# Patient Record
Sex: Male | Born: 1980 | Race: White | Hispanic: No | Marital: Married | Smoking: Former smoker
Health system: Southern US, Community
[De-identification: ages and names within clinical notes are randomized; demographics above are authoritative.]

## PROBLEM LIST (undated history)

## (undated) DIAGNOSIS — K219 Gastro-esophageal reflux disease without esophagitis: Secondary | ICD-10-CM

## (undated) DIAGNOSIS — I10 Essential (primary) hypertension: Secondary | ICD-10-CM

## (undated) DIAGNOSIS — E785 Hyperlipidemia, unspecified: Secondary | ICD-10-CM

## (undated) HISTORY — DX: Hyperlipidemia, unspecified: E78.5

## (undated) HISTORY — DX: Gastro-esophageal reflux disease without esophagitis: K21.9

## (undated) HISTORY — DX: Essential (primary) hypertension: I10

## (undated) HISTORY — PX: INNER EAR SURGERY: SHX679

## (undated) HISTORY — PX: FRACTURE SURGERY: SHX138

## (undated) HISTORY — PX: FOOT SURGERY: SHX648

## (undated) MED FILL — Medication: Fill #2 | Status: CN

---
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
## 2003-11-23 ENCOUNTER — Ambulatory Visit: Payer: Self-pay | Admitting: *Deleted

## 2004-10-07 ENCOUNTER — Emergency Department (HOSPITAL_COMMUNITY): Admission: EM | Admit: 2004-10-07 | Discharge: 2004-10-08 | Payer: Self-pay | Admitting: Emergency Medicine

## 2004-10-17 ENCOUNTER — Emergency Department (HOSPITAL_COMMUNITY): Admission: EM | Admit: 2004-10-17 | Discharge: 2004-10-17 | Payer: Self-pay | Admitting: Emergency Medicine

## 2004-10-27 ENCOUNTER — Encounter: Admission: RE | Admit: 2004-10-27 | Discharge: 2004-10-27 | Payer: Self-pay | Admitting: Internal Medicine

## 2004-10-27 ENCOUNTER — Ambulatory Visit (HOSPITAL_COMMUNITY): Admission: RE | Admit: 2004-10-27 | Discharge: 2004-10-27 | Payer: Self-pay | Admitting: Internal Medicine

## 2005-01-01 ENCOUNTER — Emergency Department (HOSPITAL_COMMUNITY): Admission: EM | Admit: 2005-01-01 | Discharge: 2005-01-01 | Payer: Self-pay | Admitting: Emergency Medicine

## 2005-03-26 ENCOUNTER — Emergency Department (HOSPITAL_COMMUNITY): Admission: EM | Admit: 2005-03-26 | Discharge: 2005-03-26 | Payer: Self-pay | Admitting: *Deleted

## 2005-03-28 ENCOUNTER — Emergency Department (HOSPITAL_COMMUNITY): Admission: EM | Admit: 2005-03-28 | Discharge: 2005-03-28 | Payer: Self-pay | Admitting: Family Medicine

## 2005-07-10 ENCOUNTER — Emergency Department (HOSPITAL_COMMUNITY): Admission: EM | Admit: 2005-07-10 | Discharge: 2005-07-10 | Payer: Self-pay | Admitting: Emergency Medicine

## 2005-09-13 ENCOUNTER — Emergency Department (HOSPITAL_COMMUNITY): Admission: EM | Admit: 2005-09-13 | Discharge: 2005-09-13 | Payer: Self-pay | Admitting: Emergency Medicine

## 2006-01-24 ENCOUNTER — Emergency Department (HOSPITAL_COMMUNITY): Admission: EM | Admit: 2006-01-24 | Discharge: 2006-01-24 | Payer: Self-pay | Admitting: Emergency Medicine

## 2006-02-20 ENCOUNTER — Emergency Department (HOSPITAL_COMMUNITY): Admission: EM | Admit: 2006-02-20 | Discharge: 2006-02-20 | Payer: Self-pay | Admitting: Emergency Medicine

## 2006-05-08 ENCOUNTER — Emergency Department (HOSPITAL_COMMUNITY): Admission: EM | Admit: 2006-05-08 | Discharge: 2006-05-08 | Payer: Self-pay | Admitting: Emergency Medicine

## 2006-05-09 ENCOUNTER — Emergency Department (HOSPITAL_COMMUNITY): Admission: EM | Admit: 2006-05-09 | Discharge: 2006-05-09 | Payer: Self-pay | Admitting: Emergency Medicine

## 2006-05-13 ENCOUNTER — Emergency Department (HOSPITAL_COMMUNITY): Admission: EM | Admit: 2006-05-13 | Discharge: 2006-05-13 | Payer: Self-pay | Admitting: Emergency Medicine

## 2015-09-07 ENCOUNTER — Emergency Department
Admission: EM | Admit: 2015-09-07 | Discharge: 2015-09-07 | Disposition: A | Discharge status: Home / Self Care | Payer: Self-pay | Attending: Student | Admitting: Student

## 2015-09-07 ENCOUNTER — Encounter: Payer: Self-pay | Admitting: Emergency Medicine

## 2015-09-07 DIAGNOSIS — K047 Periapical abscess without sinus: Secondary | ICD-10-CM | POA: Insufficient documentation

## 2015-09-07 MED ORDER — AMOXICILLIN 500 MG PO CAPS: 500.0000 mg | ORAL_CAPSULE | Freq: Three times a day (TID) | ORAL | Status: DC

## 2015-09-07 MED ORDER — IBUPROFEN 600 MG PO TABS: 600.0000 mg | ORAL_TABLET | Freq: Three times a day (TID) | ORAL | Status: DC | PRN

## 2015-09-07 NOTE — Discharge Instructions (Signed)
Dental Abscess A dental abscess is pus in or around a tooth. HOME CARE  Take medicines only as told by your dentist.  If you were prescribed antibiotic medicine, finish all of it even if you start to feel better.  Rinse your mouth (gargle) often with salt water.  Do not drive or use heavy machinery, like a lawn mower, while taking pain medicine.  Do not apply heat to the outside of your mouth.  Keep all follow-up visits as told by your dentist. This is important. GET HELP IF:  Your pain is worse, and medicine does not help. GET HELP RIGHT AWAY IF:  You have a fever or chills.  Your symptoms suddenly get worse.  You have a very bad headache.  You have problems breathing or swallowing.  You have trouble opening your mouth.  You have puffiness (swelling) in your neck or around your eye.   This information is not intended to replace advice given to you by your health care provider. Make sure you discuss any questions you have with your health care provider.   Document Released: 07/07/2014 Document Reviewed: 07/07/2014 Elsevier Interactive Patient Education 2016 [place] [facility]  [place] [facility] and [email or web address] [phone])  [facility] [phone])  [place] [phone] ext [phone])  [place] [phone])  [facility] [phone]) This clinic caters to the indigent population and is on a lottery system. Location: [facility], [place], [place], [place] [facility] Hours: Wednesdays from 6pm - 9pm, patients seen by a lottery system. For dates, call or go to [email or web address] Services: Cleanings, fillings and simple extractions. Payment Options: DENTAL WORK IS FREE OF CHARGE. Bring proof  of income or support. Best way to get seen: Arrive at 5:15 pm - this is a lottery, NOT first come/first serve, so arriving earlier will not increase your chances of being seen.     [facility] [phone] Select option 1 for emergencies   Location: [facility], [place], [address] [place] Clinic Hours: No walk-ins accepted - call the day before to schedule an appointment. Check in times are 9:30 am and 1:30 pm. Services: Simple extractions, temporary fillings, pulpectomy/pulp debridement, uncomplicated abscess drainage. Payment Options: PAYMENT IS DUE AT THE TIME OF SERVICE.  Fee is usually $100-200, additional surgical procedures (e.g. abscess drainage) may be extra. Cash, checks, Visa/MasterCard accepted.  Can file Medicaid if patient is covered for dental - patient should call case worker to check. No discount for [facility] patients. Best way to get seen: MUST call the day before and get onto the schedule. Can usually be seen the next 1-2 days. No walk-ins accepted.     [facility] [phone]   Location: [place], [place] [facility] Hours: M, W, Th, F 8am or 1:30pm, Tues 9a or 1:30 - first come/first served. Services: Simple extractions, temporary fillings, uncomplicated abscess drainage.  You do not need to be an [facility] resident. Payment Options: PAYMENT IS DUE AT THE TIME OF SERVICE. Dental insurance, otherwise sliding scale - bring proof of income or support. Depending on income and treatment needed, cost is usually $50-200. Best way to get seen: Arrive early as it is first come/first served.     [facility] [phone]  Location: °[address] °Clinic Hours: °Mon-Thu 8a-5p °Services: °Most basic dental services including extractions and fillings. °Payment Options: °PAYMENT IS DUE AT THE TIME OF  SERVICE. °Sliding scale, up to 50% off - bring proof if income or support. °Medicaid with dental option accepted. °Best way to get seen: °Call to schedule an appointment, can usually be seen within 2 weeks OR they will try to see walk-ins - show up at 8a or 2p (you may have to wait). °  °  °[facility] °[phone] °[place] RESIDENTS ONLY °  °Location: °[facility], [address], [place], [place] [address] °Clinic Hours: By appointment only. °Monday - Thursday 8am-5pm, Friday 8am-12pm °Services: Cleanings, fillings, extractions. °Payment Options: °PAYMENT IS DUE AT THE TIME OF SERVICE. °Cash, Visa or MasterCard. Sliding scale - $30 minimum per service. °Best way to get seen: °Come in to office, complete packet and make an appointment - need proof of income °or support monies for each household member and proof of [place] residence. °Usually takes about a month to get in. °  °  °[facility] °[phone] °  °Location: °[address] [place] °Clinic Hours: Walk-in Urgent Care Dental Services are offered Monday-Friday mornings only. °The numbers of emergencies accepted daily is limited to the number of °providers available. °Maximum 15 - Mondays, Wednesdays & Thursdays °Maximum 10 - Tuesdays & Fridays °Services: °You do not need to be a  [place] resident to be seen for a dental emergency. °Emergencies are defined as pain, swelling, abnormal bleeding, or dental trauma. Walkins will receive x-rays if needed. °NOTE: Dental cleaning is not an emergency. °Payment Options: °PAYMENT IS DUE AT THE TIME OF SERVICE. °Minimum co-pay is $40.00 for uninsured patients. °Minimum co-pay is $3.00 for Medicaid with dental coverage. °Dental Insurance is accepted and must be presented at time of visit. °Medicare does not cover dental. °Forms of payment: Cash, credit card, checks. °Best way to get seen: °If not previously registered with the clinic,  walk-in dental registration begins at 7:15 am and is on a first come/first serve basis. °If previously registered with the clinic, call to make an appointment. °  °  °[facility] °[phone] °[place] RESIDENTS ONLY °  °Location: °[address], [place], [place] °Clinic Hours: °Mon-Thu 10a-2p °Services: Extractions only! °Payment Options: °FREE (donations accepted) - bring proof of income or support °Best way to get seen: °Call and schedule an appointment OR come at 8am on the 1st Monday of every month (except for holidays) when it is first come/first served. °  °  °[facility] °[phone] °  °Location: °[address], [place] °Clinic Hours: °Friday mornings °Services, Payment Options, Best way to get seen: °Call for info °

## 2015-09-07 NOTE — ED Notes (Signed)
States he developed pain to left side os face about 2 weeks ago  Unsure if it is ear pain or tooth pain  Min relief with OTC meds

## 2015-09-07 NOTE — ED Provider Notes (Signed)
[facility] Emergency Department Provider Note  ____________________________________________  Time seen: Approximately 11:10 AM  I have reviewed the triage vital signs and the nursing notes.   HISTORY  Chief Complaint Facial Pain   HPI Logan Ball is a 35 y.o. male is here complaining of left sided facial pain for about 2 weeks. Patient states he is uncertain whether it is his ear that is causing the pain or his tooth. He is taken over-the-counter medications such as Tylenol without any relief. Patient is unaware of any fever and denies any previous ear infections. He denies any cough, congestion, sore throat or nasal congestion. Patient states he is aware that he does have some problems with his teeth. Wife states that he has been taking Tylenol 500 mg for the last several days without any relief. Currently he rates his pain as an 8/10.   History reviewed. No pertinent past medical history.  There are no active problems to display for this patient.   History reviewed. No pertinent past surgical history.  Current Outpatient Rx  Name  Route  Sig  Dispense  Refill  . amoxicillin (AMOXIL) 500 MG capsule   Oral   Take 1 capsule (500 mg total) by mouth 3 (three) times daily.   30 capsule   0   . ibuprofen (ADVIL,MOTRIN) 600 MG tablet   Oral   Take 1 tablet (600 mg total) by mouth every 8 (eight) hours as needed.   30 tablet   0     Allergies Review of patient's allergies indicates no known allergies.  No family history on file.  Social History Social History  Substance Use Topics  . Smoking status: Never Smoker   . Smokeless tobacco: None  . Alcohol Use: No    Review of Systems Constitutional: No fever/chills Eyes: No visual changes. ENT: No sore throat.Positive left ear pain. Positive dental caries. Cardiovascular: Denies chest pain. Respiratory: Denies shortness of breath. Gastrointestinal:   No nausea, no vomiting.     Musculoskeletal: Negative for back pain. Skin: Negative for rash. Neurological: Negative for headaches, focal weakness or numbness.  10-point ROS otherwise negative.  ____________________________________________   PHYSICAL EXAM:  VITAL SIGNS: ED Triage Vitals  Enc Vitals Group     BP 09/07/15 1053 160/100 mmHg     Pulse Rate 09/07/15 1053 71     Resp 09/07/15 1053 18     Temp 09/07/15 1053 98 F (36.7 C)     Temp src --      SpO2 09/07/15 1053 99 %     Weight 09/07/15 1053 310 lb (140.615 kg)     Height 09/07/15 1053 6' (1.829 m)     Head Cir --      Peak Flow --      Pain Score 09/07/15 1051 8     Pain Loc --      Pain Edu? --      Excl. in GC? --    Constitutional: Alert and oriented. Well appearing and in no acute distress. Eyes: Conjunctivae are normal. PERRL. EOMI. Head: Atraumatic. Nose: No congestion/rhinnorhea.   EACs bilaterally are with moderate amount of cerumen however her TMs are visible and there is no erythema or injection seen. Mouth/Throat: Mucous membranes are moist.  Oropharynx non-erythematous. There are multiple dental caries noted on the upper and lower left side without any obvious abscess. Multiple teeth are eroded down to the gumline. Neck: No stridor.   Cardiovascular: Normal rate, regular rhythm.  Grossly normal heart sounds.  Good peripheral circulation. Respiratory: Normal respiratory effort.  No retractions. Lungs CTAB. Musculoskeletal: Moves upper and lower extremities without any difficulty. Normal gait was noted. Neurologic:  Normal speech and language. No gross focal neurologic deficits are appreciated. No gait instability. Skin:  Skin is warm, dry and intact. No rash noted. Psychiatric: Mood and affect are normal. Speech and behavior are normal.  ____________________________________________   LABS (all labs ordered are listed, but only abnormal results are displayed)  Labs Reviewed - No data to display   PROCEDURES  Procedure(s)  performed: None  Procedures  Critical Care performed: No  ____________________________________________   INITIAL IMPRESSION / ASSESSMENT AND PLAN / ED COURSE  Pertinent labs & imaging results that were available during my care of the patient were reviewed by me and considered in my medical decision making (see chart for details).  Patient does not have any insurance and was given a list of all dental clinics in the area that will care for his teeth a lower cost. Patient was placed on Amoxil 500 mg 3 times a day for 10 days and ibuprofen 600 mg 3 times a day with food as needed for pain. Patient is encouraged to call any of the dental clinics closest to his home. ____________________________________________   FINAL CLINICAL IMPRESSION(S) / ED DIAGNOSES  Final diagnoses:  Dental abscess      NEW MEDICATIONS STARTED DURING THIS VISIT:  Discharge Medication List as of 09/07/2015 11:16 AM    START taking these medications   Details  amoxicillin (AMOXIL) 500 MG capsule Take 1 capsule (500 mg total) by mouth 3 (three) times daily., Starting 09/07/2015, Until Discontinued, Print    ibuprofen (ADVIL,MOTRIN) 600 MG tablet Take 1 tablet (600 mg total) by mouth every 8 (eight) hours as needed., Starting 09/07/2015, Until Discontinued, Print         Note:  This document was prepared using Dragon voice recognition software and may include unintentional dictation errors.    Tommi Rumpshonda L Summers, PA-C 09/07/15 1318  Gayla DossEryka A Gayle, MD 09/07/15 1535

## 2015-12-07 ENCOUNTER — Encounter: Payer: Self-pay | Admitting: Emergency Medicine

## 2015-12-07 ENCOUNTER — Emergency Department
Admission: EM | Admit: 2015-12-07 | Discharge: 2015-12-07 | Disposition: A | Discharge status: Home / Self Care | Payer: Self-pay | Attending: Emergency Medicine | Admitting: Emergency Medicine

## 2015-12-07 DIAGNOSIS — L732 Hidradenitis suppurativa: Secondary | ICD-10-CM | POA: Insufficient documentation

## 2015-12-07 MED ORDER — NAPROXEN 500 MG PO TABS: 500.0000 mg | ORAL_TABLET | Freq: Two times a day (BID) | ORAL | 0 refills | Status: DC

## 2015-12-07 MED ORDER — SULFAMETHOXAZOLE-TRIMETHOPRIM 800-160 MG PO TABS: 1.0000 | ORAL_TABLET | Freq: Two times a day (BID) | ORAL | 0 refills | Status: DC

## 2015-12-07 NOTE — ED Triage Notes (Signed)
C/O "knot" to back of head x 3-4 days.    Raised round area approximately 2 cm diameter with red small red center seen to base of head / neck.   Area hard to palpation.  No drainage seen.

## 2015-12-07 NOTE — ED Notes (Signed)

## 2015-12-07 NOTE — ED Notes (Signed)
See triage note.  Possible abscess area note to back of neck  Developed area about 3-4 days ago

## 2015-12-07 NOTE — ED Provider Notes (Signed)
[facility] Emergency Department Provider Note  ____________________________________________  Time seen: Approximately 4:03 PM  I have reviewed the triage vital signs and the nursing notes.   HISTORY  Chief Complaint Abscess    HPI Logan Ball is a 35 y.o. male , NAD, presents to the emergency department with 3 day history of skin sore on the back of his neck. Patient states he has a long history of recurrent skin sores and "knots" about his body over the course of the last several years. He states that normally they come to a head, lose and resolve on their own. This area has continued to get larger and more painful over the last 24 hours. Denies any oozing or weeping. Has had no fevers, chills, body aches, headaches. Has had no chest pain, shortness of breath, abdominal pain, nausea or vomiting. Denies any history of diabetes. Notes that he has had similar sores under his arms that have been somewhat recurrent. 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   History reviewed. No pertinent past medical history.  There are no active problems to display for this patient.   History reviewed. No pertinent surgical history.  Prior to Admission medications   Medication Sig Start Date End Date Taking? Authorizing Provider  naproxen (NAPROSYN) 500 MG tablet Take 1 tablet (500 mg total) by mouth 2 (two) times daily with a meal. 12/07/15   Jacob Chamblee L Janes Colegrove, PA-C  sulfamethoxazole-trimethoprim (BACTRIM DS,SEPTRA DS) 800-160 MG tablet Take 1 tablet by mouth 2 (two) times daily. 12/07/15   Brody Kump L Yariel Ferraris, PA-C    Allergies Review of patient's allergies indicates no known allergies.  No family history on file.  Social History Social History  Substance Use Topics  . Smoking status: Never Smoker  . Smokeless tobacco: Never Used  . Alcohol use No     Review of Systems  Constitutional: No fever/chills Cardiovascular: No chest  pain. Respiratory: No shortness of breath. Gastrointestinal: No abdominal pain.  No nausea, vomiting.   Musculoskeletal: Positive for posterior neck pain at site of skin sore.  Skin: Positive skin sore back of neck. Negative for rash, redness, oozing, weeping. Neurological: Negative for headaches. 10-point ROS otherwise negative.  ____________________________________________   PHYSICAL EXAM:  VITAL SIGNS: ED Triage Vitals  Enc Vitals Group     BP 12/07/15 1523 130/75     Pulse Rate 12/07/15 1522 95     Resp 12/07/15 1522 16     Temp 12/07/15 1522 98.3 F (36.8 C)     Temp Source 12/07/15 1522 Oral     SpO2 12/07/15 1523 98 %     Weight 12/07/15 1523 (!) 320 lb (145.2 kg)     Height 12/07/15 1523 6\' 1"  (1.854 m)     Head Circumference --      Peak Flow --      Pain Score 12/07/15 1523 6     Pain Loc --      Pain Edu? --      Excl. in GC? --      Constitutional: Alert and oriented. Well appearing and in no acute distress. Eyes: Conjunctivae are normal.  Head: Atraumatic. Neck: Supple with full range of motion. No cervical spine tenderness to palpation. Hematological/Lymphatic/Immunilogical: No cervical lymphadenopathy. Cardiovascular: Good peripheral circulation. Respiratory: Normal respiratory effort without tachypnea or retractions.  Neurologic:  Normal speech and language. No gross focal neurologic deficits are appreciated.  Skin:  2 cm annular cystic-like  lesion noted below the skin about the posterior neck/base of scalp. Area is surrounded by dilated hair follicles in pinpoint lesions. No erythema or abnormal warmth. No oozing or weeping. Skin about patient's bilateral underarms with the same pinpoint lesions and dilated hair follicles with some scarring. Multiple small, non-painful cystic lesions are noted. Skin is warm, dry and intact.  Psychiatric: Mood and affect are normal. Speech and behavior are normal. Patient exhibits appropriate insight and  judgement.   ____________________________________________   LABS  None ____________________________________________  EKG  None ____________________________________________  RADIOLOGY  None ____________________________________________    PROCEDURES  Procedure(s) performed: None   Procedures   Medications - No data to display   ____________________________________________   INITIAL IMPRESSION / ASSESSMENT AND PLAN / ED COURSE  Pertinent labs & imaging results that were available during my care of the patient were reviewed by me and considered in my medical decision making (see chart for details).  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 Patient will be discharged home with prescriptions for Bactrim DS and Naprosyn to take as directed. Patient is to apply warm compress the affected area 20 minutes 3-4 times daily as needed. Patient was given a work note saying he can return to work tomorrow without any restrictions. Patient is to follow up with [facility] if symptoms persist past this treatment course. Patient is also advised to establish care with a local community primary care provider and a list was given for him to research and schedule an appointment. Patient is given ED precautions to return to the ED for any worsening or new symptoms.    ____________________________________________  FINAL CLINICAL IMPRESSION(S) / ED DIAGNOSES  Final diagnoses:  Hydradenitis  Acne inversa      NEW MEDICATIONS STARTED DURING THIS VISIT:  Discharge Medication List as of 12/07/2015  4:08 PM    START taking these medications   Details  naproxen (NAPROSYN) 500 MG tablet Take 1 tablet (500 mg total) by mouth 2 (two) times daily with a meal., Starting Tue 12/07/2015, Print    sulfamethoxazole-trimethoprim (BACTRIM DS,SEPTRA DS) 800-160 MG tablet Take 1 tablet by mouth 2 (two) times daily., Starting Tue 12/07/2015, Print              Ernestene KielJami L MazonHagler, PA-C 12/07/15 1641    Arnaldo NatalPaul F Malinda, MD 12/07/15 2018

## 2019-08-18 ENCOUNTER — Emergency Department
Admission: EM | Admit: 2019-08-18 | Discharge: 2019-08-18 | Disposition: A | Discharge status: Home / Self Care | Payer: Self-pay | Attending: Emergency Medicine | Admitting: Emergency Medicine

## 2019-08-18 ENCOUNTER — Encounter: Payer: Self-pay | Admitting: Emergency Medicine

## 2019-08-18 ENCOUNTER — Other Ambulatory Visit: Payer: Self-pay

## 2019-08-18 ENCOUNTER — Emergency Department: Payer: Self-pay

## 2019-08-18 DIAGNOSIS — I10 Essential (primary) hypertension: Secondary | ICD-10-CM | POA: Insufficient documentation

## 2019-08-18 DIAGNOSIS — R0789 Other chest pain: Secondary | ICD-10-CM | POA: Insufficient documentation

## 2019-08-18 DIAGNOSIS — Z79899 Other long term (current) drug therapy: Secondary | ICD-10-CM | POA: Insufficient documentation

## 2019-08-18 LAB — BASIC METABOLIC PANEL
Anion gap: 6 (ref 5–15)
BUN: 15 mg/dL (ref 6–20)
CO2: 26 mmol/L (ref 22–32)
Calcium: 9.6 mg/dL (ref 8.9–10.3)
Chloride: 109 mmol/L (ref 98–111)
Creatinine, Ser: 0.97 mg/dL (ref 0.61–1.24)
GFR calc Af Amer: >60 mL/min (ref >60)
GFR calc non Af Amer: >60 mL/min (ref >60)
Glucose, Bld: 109 mg/dL — ABNORMAL HIGH (ref 70–99)
Potassium: 3.9 mmol/L (ref 3.5–5.1)
Sodium: 141 mmol/L (ref 135–145)

## 2019-08-18 LAB — CBC
HCT: 42 % (ref 39.0–52.0)
Hemoglobin: 15.4 g/dL (ref 13.0–17.0)
MCH: 29.2 pg (ref 26.0–34.0)
MCHC: 36.7 g/dL — ABNORMAL HIGH (ref 30.0–36.0)
MCV: 79.7 fL — ABNORMAL LOW (ref 80.0–100.0)
Platelets: 308 10*3/uL (ref 150–400)
RBC: 5.27 MIL/uL (ref 4.22–5.81)
RDW: 12.8 % (ref 11.5–15.5)
WBC: 5.8 10*3/uL (ref 4.0–10.5)
nRBC: 0 % (ref 0.0–0.2)

## 2019-08-18 LAB — TROPONIN I (HIGH SENSITIVITY): Troponin I (High Sensitivity): 5 ng/L (ref <18)

## 2019-08-18 MED ORDER — AMLODIPINE BESYLATE 5 MG PO TABS: 5.0000 mg | ORAL_TABLET | Freq: Every day | ORAL | 1 refills | Status: DC

## 2019-08-18 MED ORDER — NAPROXEN 500 MG PO TABS: 500.0000 mg | ORAL_TABLET | Freq: Two times a day (BID) | ORAL | 2 refills | Status: DC

## 2019-08-18 MED ORDER — LOSARTAN POTASSIUM 25 MG PO TABS: 25.0000 mg | ORAL_TABLET | Freq: Every day | ORAL | 1 refills | Status: DC

## 2019-08-18 MED ORDER — NAPROXEN 500 MG PO TABS
500.0000 mg | ORAL_TABLET | Freq: Once | ORAL | Status: AC
  Administered 2019-08-18: 500 mg via ORAL
  Filled 2019-08-18: qty 1

## 2019-08-18 MED ORDER — SODIUM CHLORIDE 0.9% FLUSH: 3.0000 mL | Freq: Once | INTRAVENOUS | Status: DC

## 2019-08-18 NOTE — ED Triage Notes (Signed)
C/O left sided and mid sternal chest pain x 1 day.  Describes pain as sharp.  No aggravating factors associated with pain.  AAOx3. Skin warm and dry. NAD

## 2019-08-18 NOTE — ED Provider Notes (Signed)
[facility] Emergency Department Provider Note   ____________________________________________    I have reviewed the triage vital signs and the nursing notes.   HISTORY  Chief Complaint Chest Pain     HPI Logan Ball is a 39 y.o. male who presents with complaints of chest pain.  Patient reports greater than 24 hours of constant chest discomfort made worse by moving his left arm.  Chest pain is just left of sternum and he reports tenderness to palpation.  He reports significant lifting and physical activity at his job.  No history of heart disease.  No shortness of breath.  No pleurisy.  No radiation.  No calf pain or swelling.  Is not take anything for this.  No nausea vomiting or diaphoresis  History reviewed. No pertinent past medical history.  There are no problems to display for this patient.   History reviewed. No pertinent surgical history.  Prior to Admission medications   Medication Sig Start Date End Date Taking? Authorizing Provider  amLODipine (NORVASC) 5 MG tablet Take 1 tablet (5 mg total) by mouth daily. 08/18/19 08/17/20  Jene Every, MD  losartan (COZAAR) 25 MG tablet Take 1 tablet (25 mg total) by mouth daily. 08/18/19   Jene Every, MD  naproxen (NAPROSYN) 500 MG tablet Take 1 tablet (500 mg total) by mouth 2 (two) times daily with a meal. 08/18/19   Jene Every, MD  sulfamethoxazole-trimethoprim (BACTRIM DS,SEPTRA DS) 800-160 MG tablet Take 1 tablet by mouth 2 (two) times daily. 12/07/15   Hagler, Jami L, PA-C     Allergies Patient has no known allergies.  No family history on file.  Social History Social History   Tobacco Use  . Smoking status: Never Smoker  . Smokeless tobacco: Never Used  Substance Use Topics  . Alcohol use: No  . Drug use: Not on file    Review of Systems  Constitutional: No fever/chills Eyes: No visual changes.  ENT: No sore throat. Cardiovascular: As above Respiratory:  As Gastrointestinal: No abdominal pain.    Genitourinary: Negative for dysuria. Musculoskeletal: Negative for back pain. Skin: Negative for rash. Neurological: Negative for headaches    ____________________________________________   PHYSICAL EXAM:  VITAL SIGNS: ED Triage Vitals  Enc Vitals Group     BP 08/18/19 0940 (!) 170/96     Pulse Rate 08/18/19 0940 88     Resp 08/18/19 0940 16     Temp 08/18/19 0940 97.8 F (36.6 C)     Temp Source 08/18/19 0940 Oral     SpO2 08/18/19 0940 98 %     Weight 08/18/19 0941 (!) 145.2 kg (320 lb 1.7 oz)     Height 08/18/19 0941 1.854 m (6\' 1" )     Head Circumference --      Peak Flow --      Pain Score 08/18/19 0941 8     Pain Loc --      Pain Edu? --      Excl. in GC? --     Constitutional: Alert and oriented.   Nose: No congestion/rhinnorhea. Mouth/Throat: Mucous membranes are moist.    Cardiovascular: Normal rate, regular rhythm. Grossly normal heart sounds.  Good peripheral circulation.  Tenderness to palpation along the left sternal border, no rash Respiratory: Normal respiratory effort.  No retractions. Lungs CTAB. Gastrointestinal: Soft and nontender. No distention.    Musculoskeletal: No lower extremity tenderness nor edema.  Warm and well perfused Neurologic:  Normal speech and language. No  gross focal neurologic deficits are appreciated.  Skin:  Skin is warm, dry and intact. No rash noted. Psychiatric: Mood and affect are normal. Speech and behavior are normal.  ____________________________________________   LABS (all labs ordered are listed, but only abnormal results are displayed)  Labs Reviewed  BASIC METABOLIC PANEL - Abnormal; Notable for the following components:      Result Value   Glucose, Bld 109 (*)    All other components within normal limits  CBC - Abnormal; Notable for the following components:   MCV 79.7 (*)    MCHC 36.7 (*)    All other components within normal limits  TROPONIN I (HIGH  SENSITIVITY)   ____________________________________________  EKG  ED ECG REPORT I, Lavonia Drafts, the attending physician, personally viewed and interpreted this ECG.  Date: 08/18/2019  Rhythm: normal sinus rhythm QRS Axis: normal Intervals: normal ST/T Wave abnormalities: normal Narrative Interpretation: no evidence of acute ischemia  ____________________________________________  RADIOLOGY  Chest x-ray reviewed by me, no pneumothorax or infiltrate ____________________________________________   PROCEDURES  Procedure(s) performed: No  Procedures   Critical Care performed: No ____________________________________________   INITIAL IMPRESSION / ASSESSMENT AND PLAN / ED COURSE  Pertinent labs & imaging results that were available during my care of the patient were reviewed by me and considered in my medical decision making (see chart for details).  Patient presents with chest pain as described above.  Differential includes chest wall pain including costochondritis, less likely ACS myocarditis/pericarditis.  Chest x-ray reviewed by me is reassuring.  EKG is also unremarkable making ACS quite unlikely.  Troponin is normal given that the pain has been constant for greater than 24 hours.  Otherwise lab work is unremarkable.  Patient treated with p.o. naproxen as he refused IM medications because of fear of needles.  Appropriate for discharge at this time with NSAIDs for likely costochondritis.  I will have him follow-up with cardiology regardless given elevated blood pressure, he is not on any medications, will start him on amlodipine and losartan    ____________________________________________   FINAL CLINICAL IMPRESSION(S) / ED DIAGNOSES  Final diagnoses:  Chest wall pain  Essential hypertension        Note:  This document was prepared using Dragon voice recognition software and may include unintentional dictation errors.   Lavonia Drafts, MD 08/18/19  1231

## 2019-11-14 ENCOUNTER — Other Ambulatory Visit: Payer: Self-pay | Admitting: Cardiovascular Disease

## 2019-11-21 ENCOUNTER — Ambulatory Visit: Payer: Self-pay | Admitting: Pharmacy Technician

## 2019-11-21 DIAGNOSIS — Z79899 Other long term (current) drug therapy: Secondary | ICD-10-CM

## 2019-11-26 ENCOUNTER — Other Ambulatory Visit: Payer: Self-pay

## 2019-11-26 NOTE — Progress Notes (Signed)
Completed [facility] application and contract.  Patient agreed to all terms of the [facility] contract.    Patient approved to receive medication assistance at [facility] until time for re-certification in 2022, and as long as eligibility criteria continues to be met.    Provided patient with community resource material based on his particular needs.    Lowen Barringer J. Julianny Milstein Care Manager [facility]  

## 2020-01-22 ENCOUNTER — Other Ambulatory Visit: Payer: Self-pay

## 2020-01-22 ENCOUNTER — Ambulatory Visit: Payer: Self-pay

## 2020-01-22 VITALS — Wt 340.0 lb

## 2020-01-22 DIAGNOSIS — Z79899 Other long term (current) drug therapy: Secondary | ICD-10-CM

## 2020-01-22 NOTE — Progress Notes (Signed)
  [facility] Visit Note  Patient: Logan Ball MRN: [id number] Date of Birth: 05-01-1980 PCP: Patient, No Pcp Per   Logan D Peery 39 y.o. male presents for a medication therapy management via telephone visit today.  Wt (!) 340 lb (154.2 kg) Comment: pt reported  BMI 44.86 kg/m   Patient Information  History reviewed. No pertinent past medical history.    Past Surgical History:  Procedure Laterality Date  . FOOT SURGERY    . INNER EAR SURGERY      History reviewed. No pertinent family history.  New Diagnoses (since last visit): leaky heart valve  Family Support: Fair  Lifestyle Diet: Breakfast: 2 egg and cheese biscuits Lunch: fruit cup Dinner: 2 tacos  Drinks: ~5 sodas/day Snacks: None    Social History   Substance and Sexual Activity  Alcohol Use Yes   Comment: socially      Social History   Tobacco Use  Smoking Status Former Smoker  Smokeless Tobacco Never Used      Health Maintenance  Topic Date Due  . Hepatitis C Screening  Never done  . COVID-19 Vaccine (1) Never done  . HIV Screening  Never done  . TETANUS/TDAP  Never done  . INFLUENZA VACCINE  Never done   Outpatient Encounter Medications as of 01/22/2020  Medication Sig  . amLODipine (NORVASC) 5 MG tablet Take 1 tablet (5 mg total) by mouth daily.  Marland Kitchen atorvastatin (LIPITOR) 40 MG tablet Take 40 mg by mouth daily.  Marland Kitchen losartan (COZAAR) 25 MG tablet Take 1 tablet (25 mg total) by mouth daily. (Patient taking differently: Take 50 mg by mouth daily. )  . Multiple Vitamin (MULTIVITAMIN) capsule Take 1 capsule by mouth daily.  . naproxen (NAPROSYN) 500 MG tablet Take 1 tablet (500 mg total) by mouth 2 (two) times daily with a meal.  . omeprazole (PRILOSEC) 10 MG capsule Take 10 mg by mouth daily.  . [DISCONTINUED] atorvastatin (LIPITOR) 10 MG tablet Take 40 mg by mouth daily.   . [DISCONTINUED] sulfamethoxazole-trimethoprim (BACTRIM DS,SEPTRA DS) 800-160 MG tablet Take 1  tablet by mouth 2 (two) times daily.   No facility-administered encounter medications on file as of 01/22/2020.   Health Maintenance/Date Completed  Last ED visit: 08/18/19 Last Visit to PCP: no PCP - will provide resources Dental Exam: unknown Eye Exam: 2019 Flu Vaccine: pt refused COVID-19 Vaccine: pt refused   Assessment and Plan:  Access/Adherence: Pt does not have a PCP. Recommended pt for [facility] referral.   Hyperlipidemia: Pt takes atorvastatin 40 mg daily. Plan: Continue atorvastatin daily. Establish care with PCP.   Hypertension: Pt takes amlodipine 5 mg daily and losartan 50 mg daily. Pt does not monitor blood pressure at home. Plan: Continue amlodipine and losartan daily. Establish care with PCP.  Reatha Armour, PharmD Pharmacy Resident  01/22/2020 12:37 PM

## 2020-03-16 ENCOUNTER — Other Ambulatory Visit: Payer: Self-pay | Admitting: Cardiovascular Disease

## 2020-04-13 ENCOUNTER — Telehealth: Payer: Self-pay | Admitting: General Practice

## 2020-04-13 NOTE — Telephone Encounter (Signed)
Patient is still interested and was informed to fill out the application and bring it to the office.

## 2020-04-28 ENCOUNTER — Telehealth: Payer: Self-pay | Admitting: Pharmacy Technician

## 2020-04-28 NOTE — Telephone Encounter (Signed)
Received updated proof of income.  Patient eligible to receive medication assistance at [facility] until time for re-certification in 1610, and as long as eligibility requirements continue to be met.  [place] [facility]

## 2020-04-30 ENCOUNTER — Telehealth: Payer: Self-pay | Admitting: Pharmacist

## 2020-04-30 NOTE — Telephone Encounter (Signed)
Provided 2022 proof of income. Approved to receive medication assistance at [facility] until time for re-certification in 5364, and as long as eligibility criteria continues to be met.   [place]

## 2020-06-09 ENCOUNTER — Other Ambulatory Visit: Payer: Self-pay | Admitting: Cardiovascular Disease

## 2020-06-09 ENCOUNTER — Other Ambulatory Visit: Payer: Self-pay

## 2020-06-09 MED ORDER — AMLODIPINE BESYLATE 5 MG PO TABS
ORAL_TABLET | Freq: Every day | ORAL | 0 refills | Status: DC
  Filled 2020-06-09: qty 90, 90d supply, fill #0

## 2020-06-09 MED FILL — Losartan Potassium Tab 100 MG: ORAL | 90 days supply | Qty: 45 | Fill #0 | Status: AC

## 2020-06-14 ENCOUNTER — Other Ambulatory Visit: Payer: Self-pay

## 2020-06-15 ENCOUNTER — Other Ambulatory Visit: Payer: Self-pay

## 2020-06-15 MED ORDER — ATORVASTATIN CALCIUM 40 MG PO TABS
40.0000 mg | ORAL_TABLET | Freq: Every day | ORAL | 3 refills | Status: DC
  Filled 2020-06-15: qty 90, 90d supply, fill #0
  Filled 2020-09-18: qty 90, 90d supply, fill #1
  Filled 2020-12-05: qty 90, 90d supply, fill #2

## 2020-06-16 ENCOUNTER — Other Ambulatory Visit: Payer: Self-pay

## 2020-06-17 ENCOUNTER — Other Ambulatory Visit: Payer: Self-pay

## 2020-06-17 MED ORDER — ATORVASTATIN CALCIUM 40 MG PO TABS
ORAL_TABLET | Freq: Every day | ORAL | 1 refills | Status: DC
  Filled 2020-12-15: qty 90, 90d supply, fill #0
  Filled ????-??-??: fill #0

## 2020-06-18 ENCOUNTER — Other Ambulatory Visit: Payer: Self-pay

## 2020-07-23 ENCOUNTER — Other Ambulatory Visit: Payer: Self-pay

## 2020-09-18 ENCOUNTER — Other Ambulatory Visit: Payer: Self-pay

## 2020-09-18 MED FILL — Losartan Potassium Tab 100 MG: ORAL | 90 days supply | Qty: 45 | Fill #1 | Status: AC

## 2020-09-20 ENCOUNTER — Other Ambulatory Visit: Payer: Self-pay

## 2020-09-21 ENCOUNTER — Other Ambulatory Visit: Payer: Self-pay

## 2020-09-21 MED ORDER — AMLODIPINE BESYLATE 5 MG PO TABS
ORAL_TABLET | ORAL | 0 refills | Status: DC
  Filled 2020-09-21: qty 90, 90d supply, fill #0

## 2020-11-04 IMAGING — CR DG CHEST 2V
1 series · 2 of 2 positions shown · non-contrast
Comparison: 10/27/2004 CT chest also from Monday October, 2004

CLINICAL DATA: Chest pain LEFT-sided and midsternal chest pain for
a day.

EXAM:
CHEST - 2 VIEW

[Series 1: dg chest 2 view · 0.14mm/px · 2 of 2 slices shown]
[im 1/2]
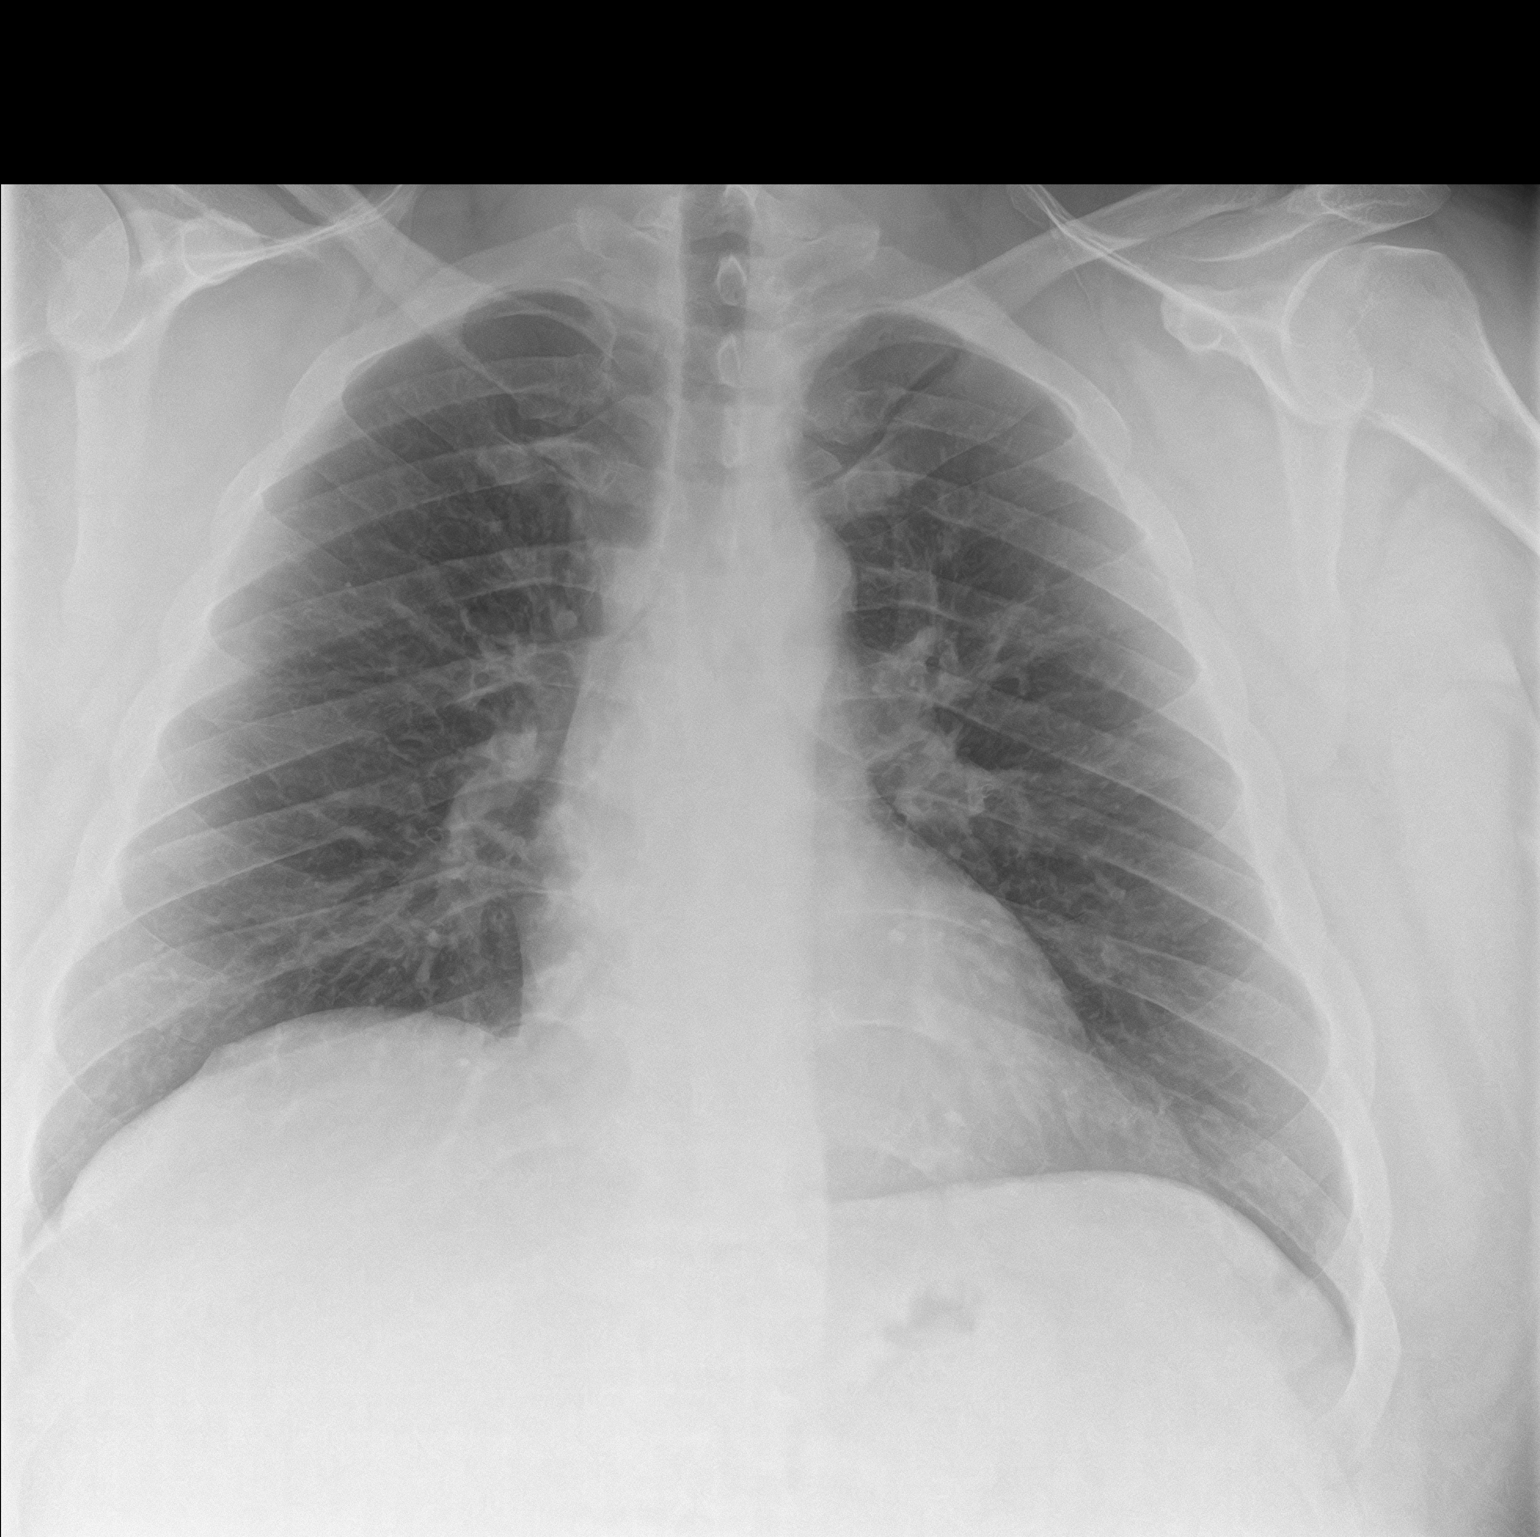
[im 2/2]
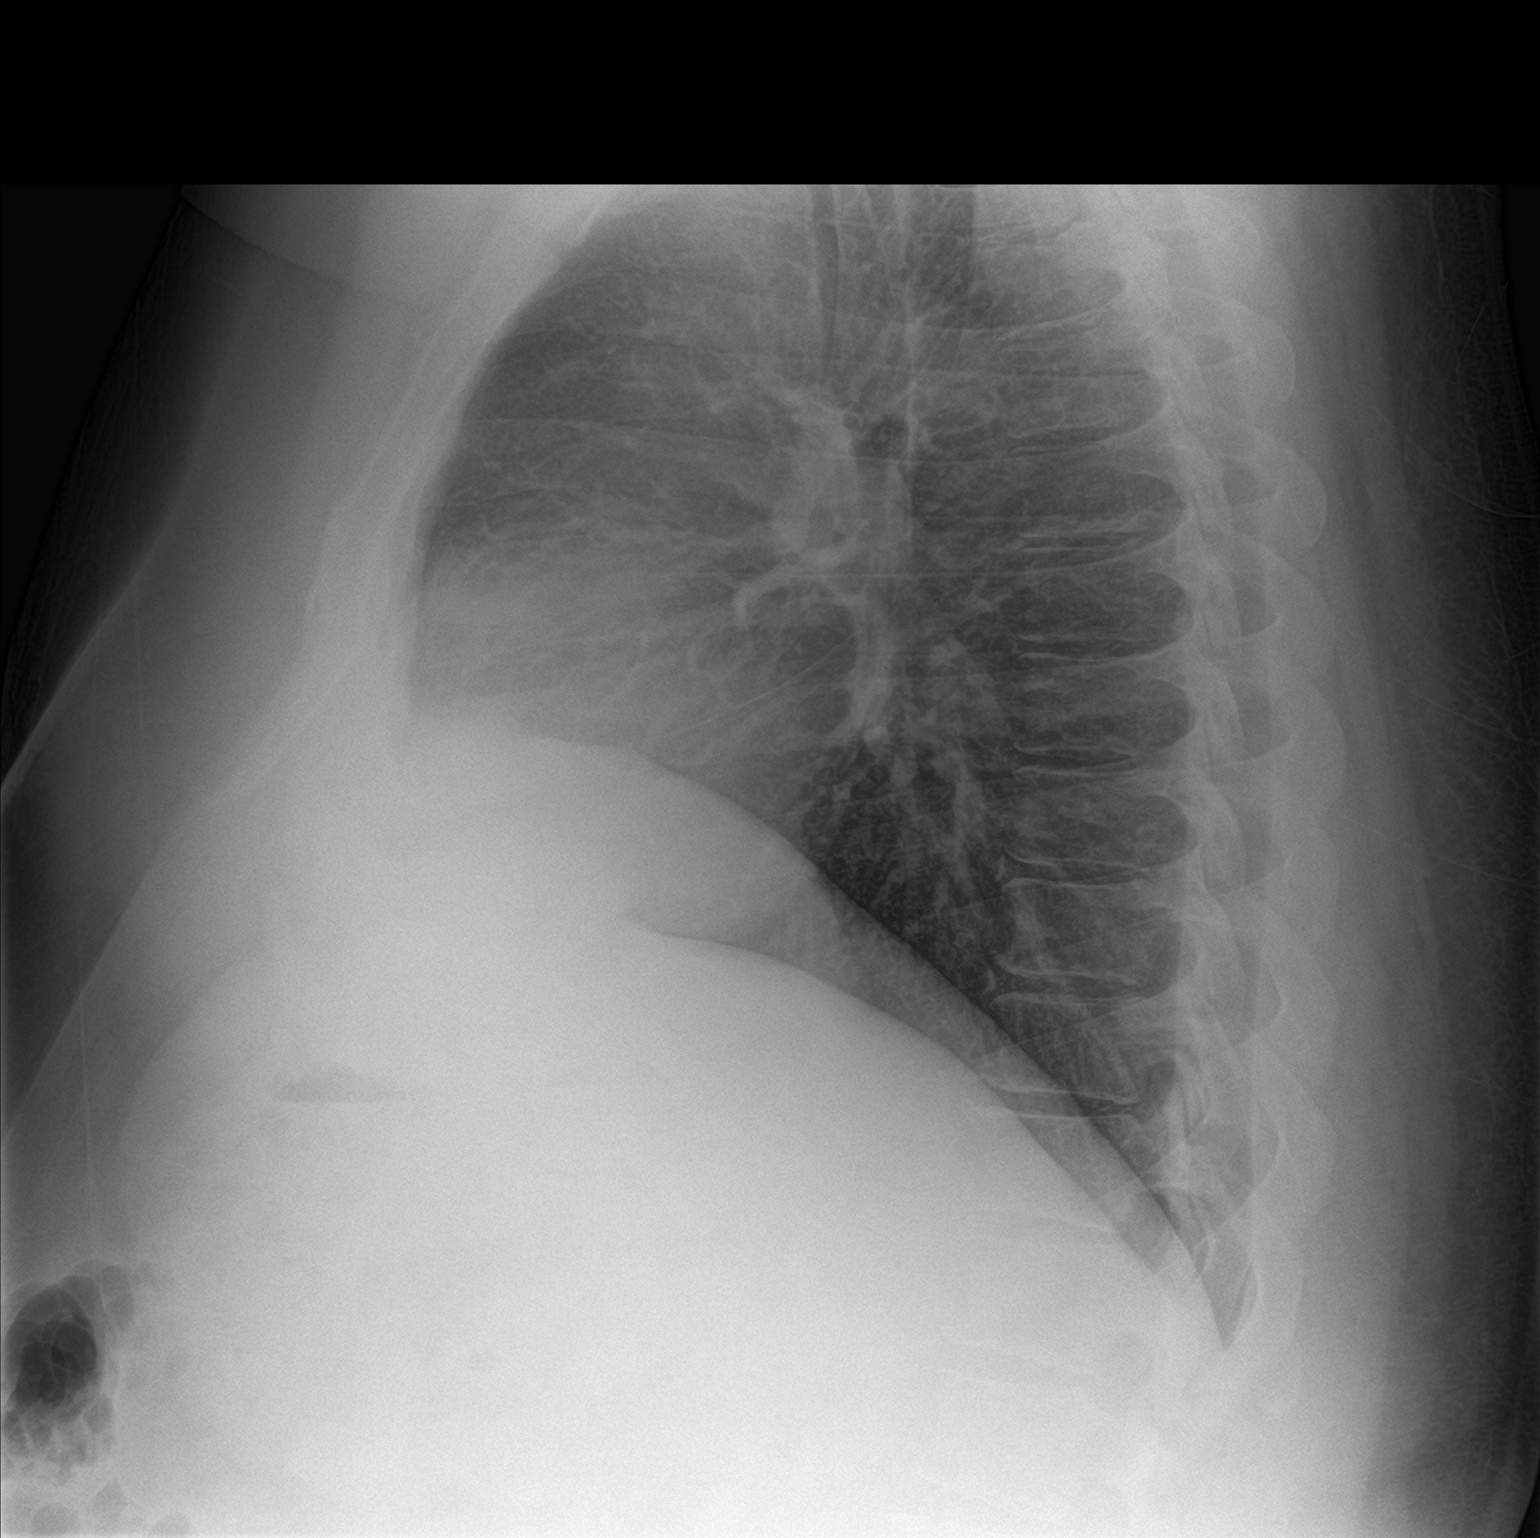

[2 of 2 positions shown; findings below may reference images not displayed]

FINDINGS: Cardiomediastinal contours and hilar structures are normal.

Lungs are clear.

Visualized skeletal structures on limited assessment are
unremarkable.
IMPRESSION: No acute cardiopulmonary disease.

## 2020-12-05 ENCOUNTER — Other Ambulatory Visit: Payer: Self-pay

## 2020-12-05 MED FILL — Losartan Potassium Tab 100 MG: ORAL | 90 days supply | Qty: 45 | Fill #2 | Status: CN

## 2020-12-06 ENCOUNTER — Other Ambulatory Visit: Payer: Self-pay

## 2020-12-07 ENCOUNTER — Other Ambulatory Visit: Payer: Self-pay

## 2020-12-08 ENCOUNTER — Other Ambulatory Visit: Payer: Self-pay

## 2020-12-08 MED ORDER — AMLODIPINE BESYLATE 5 MG PO TABS
ORAL_TABLET | ORAL | 0 refills | Status: DC
  Filled 2020-12-08: qty 90, 90d supply, fill #0

## 2020-12-08 MED ORDER — LOSARTAN POTASSIUM 50 MG PO TABS
50.0000 mg | ORAL_TABLET | Freq: Every day | ORAL | 0 refills | Status: DC
  Filled 2020-12-08: qty 90, 90d supply, fill #0

## 2020-12-15 ENCOUNTER — Other Ambulatory Visit: Payer: Self-pay

## 2020-12-15 MED FILL — Losartan Potassium Tab 100 MG: ORAL | 90 days supply | Qty: 45 | Fill #2 | Status: CN

## 2020-12-16 ENCOUNTER — Other Ambulatory Visit: Payer: Self-pay

## 2021-03-10 ENCOUNTER — Other Ambulatory Visit: Payer: Self-pay

## 2021-03-10 MED ORDER — ATORVASTATIN CALCIUM 40 MG PO TABS
ORAL_TABLET | Freq: Every day | ORAL | 0 refills | Status: DC
  Filled 2021-03-10: qty 90, 90d supply, fill #0

## 2021-03-10 MED ORDER — AMLODIPINE BESYLATE 5 MG PO TABS
ORAL_TABLET | ORAL | 0 refills | Status: DC
  Filled 2021-03-10: qty 90, 90d supply, fill #0

## 2021-03-10 MED ORDER — ATORVASTATIN CALCIUM 80 MG PO TABS
ORAL_TABLET | Freq: Every day | ORAL | 0 refills | Status: DC
  Filled 2021-03-10: qty 30, 30d supply, fill #0

## 2021-03-10 MED ORDER — LOSARTAN POTASSIUM 50 MG PO TABS
50.0000 mg | ORAL_TABLET | Freq: Every day | ORAL | 0 refills | Status: DC
  Filled 2021-03-10: qty 90, 90d supply, fill #0

## 2021-03-10 MED ORDER — METOPROLOL SUCCINATE ER 25 MG PO TB24
ORAL_TABLET | Freq: Every day | ORAL | 0 refills | Status: DC
  Filled 2021-03-10: qty 30, 30d supply, fill #0

## 2021-03-31 ENCOUNTER — Ambulatory Visit: Payer: Self-pay | Admitting: Gerontology

## 2021-03-31 ENCOUNTER — Other Ambulatory Visit: Payer: Self-pay

## 2021-03-31 ENCOUNTER — Encounter: Payer: Self-pay | Admitting: Gerontology

## 2021-03-31 VITALS — BP 142/83 | HR 87 | Temp 98.0°F | Resp 16 | Ht 71.0 in | Wt 339.4 lb

## 2021-03-31 DIAGNOSIS — Z8719 Personal history of other diseases of the digestive system: Secondary | ICD-10-CM

## 2021-03-31 DIAGNOSIS — Z7689 Persons encountering health services in other specified circumstances: Secondary | ICD-10-CM | POA: Insufficient documentation

## 2021-03-31 DIAGNOSIS — Z8639 Personal history of other endocrine, nutritional and metabolic disease: Secondary | ICD-10-CM | POA: Insufficient documentation

## 2021-03-31 DIAGNOSIS — I10 Essential (primary) hypertension: Secondary | ICD-10-CM | POA: Insufficient documentation

## 2021-03-31 DIAGNOSIS — S4992XA Unspecified injury of left shoulder and upper arm, initial encounter: Secondary | ICD-10-CM | POA: Insufficient documentation

## 2021-03-31 MED ORDER — AMLODIPINE BESYLATE 5 MG PO TABS
ORAL_TABLET | ORAL | 0 refills | Status: DC
  Filled 2021-03-31 – 2021-05-01 (×2): qty 90, fill #0

## 2021-03-31 MED ORDER — OMEPRAZOLE 20 MG PO CPDR
20.0000 mg | DELAYED_RELEASE_CAPSULE | Freq: Every day | ORAL | 3 refills | Status: DC
Start: 2021-03-31 — End: 2021-04-05
  Filled 2021-03-31: qty 30, 30d supply, fill #0

## 2021-03-31 MED ORDER — METOPROLOL SUCCINATE ER 25 MG PO TB24
ORAL_TABLET | Freq: Every day | ORAL | 3 refills | Status: DC
  Filled 2021-03-31: qty 30, fill #0
  Filled 2021-04-03: qty 30, 30d supply, fill #0

## 2021-03-31 MED ORDER — LOSARTAN POTASSIUM 50 MG PO TABS
50.0000 mg | ORAL_TABLET | Freq: Every day | ORAL | 0 refills | Status: DC
  Filled 2021-03-31 – 2021-05-01 (×2): qty 90, 90d supply, fill #0

## 2021-03-31 MED ORDER — ATORVASTATIN CALCIUM 80 MG PO TABS
ORAL_TABLET | Freq: Every day | ORAL | 2 refills | Status: DC
  Filled 2021-03-31: qty 30, fill #0
  Filled 2021-04-13: qty 60, 60d supply, fill #0

## 2021-03-31 NOTE — Patient Instructions (Signed)
Heart-Healthy Eating Plan Heart-healthy meal planning includes: Eating less unhealthy fats. Eating more healthy fats. Making other changes in your diet. Talk with your doctor or a diet specialist (dietitian) to create an eating plan that is right for you. What is my plan? Your doctor may recommend an eating plan that includes: Total fat: ______% or less of total calories a day. Saturated fat: ______% or less of total calories a day. Cholesterol: less than _________mg a day. What are tips for following this plan? Cooking Avoid frying your food. Try to bake, boil, grill, or broil it instead. You can also reduce fat by: Removing the skin from poultry. Removing all visible fats from meats. Steaming vegetables in water or broth. Meal planning  At meals, divide your plate into four equal parts: Fill one-half of your plate with vegetables and green salads. Fill one-fourth of your plate with whole grains. Fill one-fourth of your plate with lean protein foods. Eat 4-5 servings of vegetables per day. A serving of vegetables is: 1 cup of raw or cooked vegetables. 2 cups of raw leafy greens. Eat 4-5 servings of fruit per day. A serving of fruit is: 1 medium whole fruit.  cup of dried fruit.  cup of fresh, frozen, or canned fruit.  cup of 100% fruit juice. Eat more foods that have soluble fiber. These are apples, broccoli, carrots, beans, peas, and barley. Try to get 20-30 g of fiber per day. Eat 4-5 servings of nuts, legumes, and seeds per week: 1 serving of dried beans or legumes equals  cup after being cooked. 1 serving of nuts is  cup. 1 serving of seeds equals 1 tablespoon. General information Eat more home-cooked food. Eat less restaurant, buffet, and fast food. Limit or avoid alcohol. Limit foods that are high in starch and sugar. Avoid fried foods. Lose weight if you are overweight. Keep track of how much salt (sodium) you eat. This is important if you have high blood  pressure. Ask your doctor to tell you more about this. Try to add vegetarian meals each week. Fats Choose healthy fats. These include olive oil and canola oil, flaxseeds, walnuts, almonds, and seeds. Eat more omega-3 fats. These include salmon, mackerel, sardines, tuna, flaxseed oil, and ground flaxseeds. Try to eat fish at least 2 times each week. Check food labels. Avoid foods with trans fats or high amounts of saturated fat. Limit saturated fats. These are often found in animal products, such as meats, butter, and cream. These are also found in plant foods, such as palm oil, palm kernel oil, and coconut oil. Avoid foods with partially hydrogenated oils in them. These have trans fats. Examples are stick margarine, some tub margarines, cookies, crackers, and other baked goods. What foods can I eat? Fruits All fresh, canned (in natural juice), or frozen fruits. Vegetables Fresh or frozen vegetables (raw, steamed, roasted, or grilled). Green salads. Grains Most grains. Choose whole wheat and whole grains most of the time. Rice and pasta, including brown rice and pastas made with whole wheat. Meats and other proteins Lean, well-trimmed beef, veal, pork, and lamb. Chicken and turkey without skin. All fish and shellfish. Wild duck, rabbit, pheasant, and venison. Egg whites or low-cholesterol egg substitutes. Dried beans, peas, lentils, and tofu. Seeds and most nuts. Dairy Low-fat or nonfat cheeses, including ricotta and mozzarella. Skim or 1% milk that is liquid, powdered, or evaporated. Buttermilk that is made with low-fat milk. Nonfat or low-fat yogurt. Fats and oils Non-hydrogenated (trans-free) margarines. Vegetable oils, including   soybean, sesame, sunflower, olive, peanut, safflower, corn, canola, and cottonseed. Salad dressings or mayonnaise made with a vegetable oil. Beverages Mineral water. Coffee and tea. Diet carbonated beverages. Sweets and desserts Sherbet, gelatin, and fruit ice.  Small amounts of dark chocolate. Limit all sweets and desserts. Seasonings and condiments All seasonings and condiments. The items listed above may not be a complete list of foods and drinks you can eat. Contact a dietitian for more options. What foods should I avoid? Fruits Canned fruit in heavy syrup. Fruit in cream or butter sauce. Fried fruit. Limit coconut. Vegetables Vegetables cooked in cheese, cream, or butter sauce. Fried vegetables. Grains Breads that are made with saturated or trans fats, oils, or whole milk. Croissants. Sweet rolls. Donuts. High-fat crackers, such as cheese crackers. Meats and other proteins Fatty meats, such as hot dogs, ribs, sausage, bacon, rib-eye roast or steak. High-fat deli meats, such as salami and bologna. Caviar. Domestic duck and goose. Organ meats, such as liver. Dairy Cream, sour cream, cream cheese, and creamed cottage cheese. Whole-milk cheeses. Whole or 2% milk that is liquid, evaporated, or condensed. Whole buttermilk. Cream sauce or high-fat cheese sauce. Yogurt that is made from whole milk. Fats and oils Meat fat, or shortening. Cocoa butter, hydrogenated oils, palm oil, coconut oil, palm kernel oil. Solid fats and shortenings, including bacon fat, salt pork, lard, and butter. Nondairy cream substitutes. Salad dressings with cheese or sour cream. Beverages Regular sodas and juice drinks with added sugar. Sweets and desserts Frosting. Pudding. Cookies. Cakes. Pies. Milk chocolate or white chocolate. Buttered syrups. Full-fat ice cream or ice cream drinks. The items listed above may not be a complete list of foods and drinks to avoid. Contact a dietitian for more information. Summary Heart-healthy meal planning includes eating less unhealthy fats, eating more healthy fats, and making other changes in your diet. Eat a balanced diet. This includes fruits and vegetables, low-fat or nonfat dairy, lean protein, nuts and legumes, whole grains, and  heart-healthy oils and fats. This information is not intended to replace advice given to you by your health care provider. Make sure you discuss any questions you have with your health care provider. Document Revised: 07/01/2020 Document Reviewed: 07/01/2020 Elsevier Patient Education  2022 [employer] DASH Eating Plan DASH stands for Dietary Approaches to Stop Hypertension. The DASH eating plan is a healthy eating plan that has been shown to: Reduce high blood pressure (hypertension). Reduce your risk for type 2 diabetes, heart disease, and stroke. Help with weight loss. What are tips for following this plan? Reading food labels Check food labels for the amount of salt (sodium) per serving. Choose foods with less than 5 percent of the Daily Value of sodium. Generally, foods with less than 300 milligrams (mg) of sodium per serving fit into this eating plan. To find whole grains, look for the word "whole" as the first word in the ingredient list. Shopping Buy products labeled as "low-sodium" or "no salt added." Buy fresh foods. Avoid canned foods and pre-made or frozen meals. Cooking Avoid adding salt when cooking. Use salt-free seasonings or herbs instead of table salt or sea salt. Check with your health care provider or pharmacist before using salt substitutes. Do not fry foods. Cook foods using healthy methods such as baking, boiling, grilling, roasting, and broiling instead. Cook with heart-healthy oils, such as olive, canola, avocado, soybean, or sunflower oil. Meal planning  Eat a balanced diet that includes: 4 or more servings of fruits and 4 or more   servings of vegetables each day. Try to fill one-half of your plate with fruits and vegetables. 6-8 servings of whole grains each day. Less than 6 oz (170 g) of lean meat, poultry, or fish each day. A 3-oz (85-g) serving of meat is about the same size as a deck of cards. One egg equals 1 oz (28 g). 2-3 servings of low-fat dairy each  day. One serving is 1 cup (237 mL). 1 serving of nuts, seeds, or beans 5 times each week. 2-3 servings of heart-healthy fats. Healthy fats called omega-3 fatty acids are found in foods such as walnuts, flaxseeds, fortified milks, and eggs. These fats are also found in cold-water fish, such as sardines, salmon, and mackerel. Limit how much you eat of: Canned or prepackaged foods. Food that is high in trans fat, such as some fried foods. Food that is high in saturated fat, such as fatty meat. Desserts and other sweets, sugary drinks, and other foods with added sugar. Full-fat dairy products. Do not salt foods before eating. Do not eat more than 4 egg yolks a week. Try to eat at least 2 vegetarian meals a week. Eat more home-cooked food and less restaurant, buffet, and fast food. Lifestyle When eating at a restaurant, ask that your food be prepared with less salt or no salt, if possible. If you drink alcohol: Limit how much you use to: 0-1 drink a day for women who are not pregnant. 0-2 drinks a day for men. Be aware of how much alcohol is in your drink. In the [place] one drink equals one 12 oz bottle of beer (355 mL), one 5 oz glass of wine (148 mL), or one 1 oz glass of hard liquor (44 mL). General information Avoid eating more than 2,300 mg of salt a day. If you have hypertension, you may need to reduce your sodium intake to 1,500 mg a day. Work with your health care provider to maintain a healthy body weight or to lose weight. Ask what an ideal weight is for you. Get at least 30 minutes of exercise that causes your heart to beat faster (aerobic exercise) most days of the week. Activities may include walking, swimming, or biking. Work with your health care provider or dietitian to adjust your eating plan to your individual calorie needs. What foods should I eat? Fruits All fresh, dried, or frozen fruit. Canned fruit in natural juice (without added sugar). Vegetables Fresh or frozen  vegetables (raw, steamed, roasted, or grilled). Low-sodium or reduced-sodium tomato and vegetable juice. Low-sodium or reduced-sodium tomato sauce and tomato paste. Low-sodium or reduced-sodium canned vegetables. Grains Whole-grain or whole-wheat bread. Whole-grain or whole-wheat pasta. Brown rice. Oatmeal. Quinoa. Bulgur. Whole-grain and low-sodium cereals. Pita bread. Low-fat, low-sodium crackers. Whole-wheat flour tortillas. Meats and other proteins Skinless chicken or turkey. Ground chicken or turkey. Pork with fat trimmed off. Fish and seafood. Egg whites. Dried beans, peas, or lentils. Unsalted nuts, nut butters, and seeds. Unsalted canned beans. Lean cuts of beef with fat trimmed off. Low-sodium, lean precooked or cured meat, such as sausages or meat loaves. Dairy Low-fat (1%) or fat-free (skim) milk. Reduced-fat, low-fat, or fat-free cheeses. Nonfat, low-sodium ricotta or cottage cheese. Low-fat or nonfat yogurt. Low-fat, low-sodium cheese. Fats and oils Soft margarine without trans fats. Vegetable oil. Reduced-fat, low-fat, or light mayonnaise and salad dressings (reduced-sodium). Canola, safflower, olive, avocado, soybean, and sunflower oils. Avocado. Seasonings and condiments Herbs. Spices. Seasoning mixes without salt. Other foods Unsalted popcorn and pretzels. Fat-free sweets. The items   listed above may not be a complete list of foods and beverages you can eat. Contact a dietitian for more information. What foods should I avoid? Fruits Canned fruit in a light or heavy syrup. Fried fruit. Fruit in cream or butter sauce. Vegetables Creamed or fried vegetables. Vegetables in a cheese sauce. Regular canned vegetables (not low-sodium or reduced-sodium). Regular canned tomato sauce and paste (not low-sodium or reduced-sodium). Regular tomato and vegetable juice (not low-sodium or reduced-sodium). Pickles. Olives. Grains Baked goods made with fat, such as croissants, muffins, or some  breads. Dry pasta or rice meal packs. Meats and other proteins Fatty cuts of meat. Ribs. Fried meat. Bacon. Bologna, salami, and other precooked or cured meats, such as sausages or meat loaves. Fat from the back of a pig (fatback). Bratwurst. Salted nuts and seeds. Canned beans with added salt. Canned or smoked fish. Whole eggs or egg yolks. Chicken or turkey with skin. Dairy Whole or 2% milk, cream, and half-and-half. Whole or full-fat cream cheese. Whole-fat or sweetened yogurt. Full-fat cheese. Nondairy creamers. Whipped toppings. Processed cheese and cheese spreads. Fats and oils Butter. Stick margarine. Lard. Shortening. Ghee. Bacon fat. Tropical oils, such as coconut, palm kernel, or palm oil. Seasonings and condiments Onion salt, garlic salt, seasoned salt, table salt, and sea salt. Worcestershire sauce. Tartar sauce. Barbecue sauce. Teriyaki sauce. Soy sauce, including reduced-sodium. Steak sauce. Canned and packaged gravies. Fish sauce. Oyster sauce. Cocktail sauce. Store-bought horseradish. Ketchup. Mustard. Meat flavorings and tenderizers. Bouillon cubes. Hot sauces. Pre-made or packaged marinades. Pre-made or packaged taco seasonings. Relishes. Regular salad dressings. Other foods Salted popcorn and pretzels. The items listed above may not be a complete list of foods and beverages you should avoid. Contact a dietitian for more information. Where to find more information National Heart, Lung, and Blood Institute: www.nhlbi.nih.gov American Heart Association: www.heart.org Academy of Nutrition and Dietetics: www.eatright.org National Kidney Foundation: www.kidney.org Summary The DASH eating plan is a healthy eating plan that has been shown to reduce high blood pressure (hypertension). It may also reduce your risk for type 2 diabetes, heart disease, and stroke. When on the DASH eating plan, aim to eat more fresh fruits and vegetables, whole grains, lean proteins, low-fat dairy, and  heart-healthy fats. With the DASH eating plan, you should limit salt (sodium) intake to 2,300 mg a day. If you have hypertension, you may need to reduce your sodium intake to 1,500 mg a day. Work with your health care provider or dietitian to adjust your eating plan to your individual calorie needs. This information is not intended to replace advice given to you by your health care provider. Make sure you discuss any questions you have with your health care provider. Document Revised: 01/24/2019 Document Reviewed: 01/24/2019 Elsevier Patient Education  2022 Else[employer] Inc.  
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
## 2021-04-01 LAB — CBC WITH DIFFERENTIAL/PLATELET
Basophils Absolute: 0 10*3/uL (ref 0.0–0.2)
Basos: 1 %
EOS (ABSOLUTE): 0.2 10*3/uL (ref 0.0–0.4)
Eos: 2 %
Hematocrit: 42.9 % (ref 37.5–51.0)
Hemoglobin: 15.2 g/dL (ref 13.0–17.7)
Immature Grans (Abs): 0 10*3/uL (ref 0.0–0.1)
Immature Granulocytes: 0 %
Lymphocytes Absolute: 1.7 10*3/uL (ref 0.7–3.1)
Lymphs: 21 %
MCH: 29.6 pg (ref 26.6–33.0)
MCHC: 35.4 g/dL (ref 31.5–35.7)
MCV: 84 fL (ref 79–97)
Monocytes Absolute: 0.6 10*3/uL (ref 0.1–0.9)
Monocytes: 8 %
Neutrophils Absolute: 5.5 10*3/uL (ref 1.4–7.0)
Neutrophils: 68 %
Platelets: 315 10*3/uL (ref 150–450)
RBC: 5.14 x10E6/uL (ref 4.14–5.80)
RDW: 12.3 % (ref 11.6–15.4)
WBC: 8.1 10*3/uL (ref 3.4–10.8)

## 2021-04-01 LAB — COMPREHENSIVE METABOLIC PANEL
ALT: 21 IU/L (ref 0–44)
AST: 21 IU/L (ref 0–40)
Albumin/Globulin Ratio: 1.5 (ref 1.2–2.2)
Albumin: 4.6 g/dL (ref 4.0–5.0)
Alkaline Phosphatase: 122 IU/L — ABNORMAL HIGH (ref 44–121)
BUN/Creatinine Ratio: 20 (ref 9–20)
BUN: 18 mg/dL (ref 6–24)
Bilirubin Total: 0.5 mg/dL (ref 0.0–1.2)
CO2: 17 mmol/L — ABNORMAL LOW (ref 20–29)
Calcium: 9.9 mg/dL (ref 8.7–10.2)
Chloride: 105 mmol/L (ref 96–106)
Creatinine, Ser: 0.88 mg/dL (ref 0.76–1.27)
Globulin, Total: 3.1 g/dL (ref 1.5–4.5)
Glucose: 104 mg/dL — ABNORMAL HIGH (ref 70–99)
Potassium: 4.6 mmol/L (ref 3.5–5.2)
Sodium: 138 mmol/L (ref 134–144)
Total Protein: 7.7 g/dL (ref 6.0–8.5)
eGFR: 111 mL/min/{1.73_m2} (ref >59)

## 2021-04-01 LAB — LIPID PANEL
Chol/HDL Ratio: 6.8 ratio — ABNORMAL HIGH (ref 0.0–5.0)
Cholesterol, Total: 203 mg/dL — ABNORMAL HIGH (ref 100–199)
HDL: 30 mg/dL — ABNORMAL LOW (ref >39)
LDL Chol Calc (NIH): 101 mg/dL — ABNORMAL HIGH (ref 0–99)
Triglycerides: 424 mg/dL — ABNORMAL HIGH (ref 0–149)
VLDL Cholesterol Cal: 72 mg/dL — ABNORMAL HIGH (ref 5–40)

## 2021-04-01 LAB — HEMOGLOBIN A1C
Est. average glucose Bld gHb Est-mCnc: 103 mg/dL
Hgb A1c MFr Bld: 5.2 % (ref 4.8–5.6)

## 2021-04-04 ENCOUNTER — Other Ambulatory Visit: Payer: Self-pay

## 2021-04-05 ENCOUNTER — Other Ambulatory Visit: Payer: Self-pay

## 2021-04-05 ENCOUNTER — Other Ambulatory Visit: Payer: Self-pay | Admitting: Gerontology

## 2021-04-05 DIAGNOSIS — Z8719 Personal history of other diseases of the digestive system: Secondary | ICD-10-CM

## 2021-04-05 DIAGNOSIS — I10 Essential (primary) hypertension: Secondary | ICD-10-CM

## 2021-04-05 MED ORDER — METOPROLOL SUCCINATE ER 25 MG PO TB24
ORAL_TABLET | Freq: Every day | ORAL | 0 refills | Status: DC
  Filled 2021-04-05: qty 90, fill #0
  Filled 2021-05-01: qty 30, 30d supply, fill #0

## 2021-04-05 MED ORDER — OMEPRAZOLE 20 MG PO CPDR
20.0000 mg | DELAYED_RELEASE_CAPSULE | Freq: Every day | ORAL | 0 refills | Status: DC
  Filled 2021-04-05: qty 90, 90d supply, fill #0
  Filled 2021-05-01: qty 30, 30d supply, fill #0

## 2021-04-13 ENCOUNTER — Other Ambulatory Visit: Payer: Self-pay

## 2021-04-20 ENCOUNTER — Other Ambulatory Visit: Payer: Self-pay

## 2021-05-02 ENCOUNTER — Other Ambulatory Visit: Payer: Self-pay

## 2021-05-03 ENCOUNTER — Other Ambulatory Visit: Payer: Self-pay

## 2021-05-03 ENCOUNTER — Ambulatory Visit: Payer: Self-pay | Admitting: Gerontology

## 2021-05-03 ENCOUNTER — Encounter: Payer: Self-pay | Admitting: Gerontology

## 2021-05-03 VITALS — BP 136/89 | HR 78 | Temp 97.5°F | Resp 16 | Ht 71.0 in | Wt 350.6 lb

## 2021-05-03 DIAGNOSIS — M25561 Pain in right knee: Secondary | ICD-10-CM | POA: Insufficient documentation

## 2021-05-03 DIAGNOSIS — Z8719 Personal history of other diseases of the digestive system: Secondary | ICD-10-CM

## 2021-05-03 DIAGNOSIS — I10 Essential (primary) hypertension: Secondary | ICD-10-CM

## 2021-05-03 DIAGNOSIS — M25562 Pain in left knee: Secondary | ICD-10-CM

## 2021-05-03 DIAGNOSIS — Z8639 Personal history of other endocrine, nutritional and metabolic disease: Secondary | ICD-10-CM

## 2021-05-03 MED ORDER — ATORVASTATIN CALCIUM 80 MG PO TABS
ORAL_TABLET | Freq: Every day | ORAL | 1 refills | Status: DC
  Filled 2021-05-03: qty 90, fill #0
  Filled 2021-06-26: qty 90, 90d supply, fill #0
  Filled 2021-09-19: qty 30, 30d supply, fill #0
  Filled 2021-09-20 – 2021-09-23 (×4): qty 90, 90d supply, fill #0
  Filled ????-??-?? (×2): fill #1

## 2021-05-03 MED ORDER — METOPROLOL SUCCINATE ER 25 MG PO TB24
ORAL_TABLET | Freq: Every day | ORAL | 1 refills | Status: DC
  Filled 2021-05-03 – 2021-05-29 (×2): qty 90, fill #0
  Filled 2021-05-31 – 2021-09-01 (×2): qty 90, 90d supply, fill #0
  Filled 2021-09-01: qty 90, 90d supply, fill #1
  Filled ????-??-??: fill #1

## 2021-05-03 MED ORDER — AMLODIPINE BESYLATE 5 MG PO TABS
ORAL_TABLET | ORAL | 1 refills | Status: DC
  Filled 2021-05-03 – 2021-05-29 (×2): qty 90, fill #0
  Filled 2021-05-31: qty 90, 90d supply, fill #0
  Filled 2021-09-01: qty 30, 30d supply, fill #0
  Filled 2021-09-01: qty 90, 90d supply, fill #1
  Filled ????-??-??: fill #1

## 2021-05-03 MED ORDER — OMEPRAZOLE 20 MG PO CPDR
20.0000 mg | DELAYED_RELEASE_CAPSULE | Freq: Every day | ORAL | 1 refills | Status: DC
  Filled 2021-05-03 – 2021-09-01 (×3): qty 90, 90d supply, fill #0
  Filled 2021-09-01: qty 90, 90d supply, fill #1
  Filled ????-??-??: fill #1

## 2021-05-03 MED ORDER — LOSARTAN POTASSIUM 50 MG PO TABS
50.0000 mg | ORAL_TABLET | Freq: Every day | ORAL | 1 refills | Status: DC
  Filled 2021-05-03 – 2021-09-01 (×3): qty 90, 90d supply, fill #0
  Filled 2021-09-01: qty 90, 90d supply, fill #1
  Filled ????-??-??: fill #1

## 2021-05-03 NOTE — Patient Instructions (Signed)

## 2021-05-03 NOTE — Progress Notes (Signed)
Established Patient Office Visit  Subjective:  Patient ID: Logan Ball, male    DOB: 02-06-1981  Age: 41 y.o. MRN: [id number]  CC:  Chief Complaint  Patient presents with   Follow-up    Labs drawn 03/31/21    HPI Logan Ball is a 41 y/o male who has history of hypertension, hyperlipidemia, acid reflux, presents for lab review and medication refill.  He states that he is compliant with his medication, denies side effects and continues to make healthy lifestyle changes.  He gained more than 10 pounds in 1 month, he states that he drinks 2 L of diet soda daily.  His lab work unremarkable, except his triglyceride was 424 mg per DL, HDL 30 mg per DL, LDL 101 mg per DL and total cholesterol 230 mg per DL. He states that he has chronic bilateral knee pain due to arthritis, states that he does not take any medication and requests handicap sticker because he is unable to ambulate for long distance. He has a history of chest pain in 2021 and was told by the Cardiologist that his Valve is leaking, and request EKG to be done.  But currently he denies chest pain, shortness of breath and palpitation.  Overall, he states that he is doing well and offers no further complaint.    Past Medical History:  Diagnosis Date   GERD (gastroesophageal reflux disease)    Hyperlipidemia    Hypertension     Past Surgical History:  Procedure Laterality Date   FOOT SURGERY Left    FRACTURE SURGERY Left    Clavicle   INNER EAR SURGERY Right     Family History  Problem Relation Age of Onset   COPD Mother    Breast cancer Mother    Aneurysm Mother        brain   Other Father        unknown medical history   COPD Maternal Grandmother    Other Maternal Grandfather        unknown medical history   Other Paternal Grandmother        unknown medical history   Other Paternal Grandfather        unknown medical history    Social History   Socioeconomic History   Marital status: Married    Spouse  name: Not on file   Number of children: Not on file   Years of education: Not on file   Highest education level: Not on file  Occupational History   Not on file  Tobacco Use   Smoking status: Former    Packs/day: 1.50    Years: 14.00    Pack years: 21.00    Types: Cigarettes    Quit date: 2010    Years since quitting: 13.1   Smokeless tobacco: Never   Tobacco comments:    Quit smoking ~2010  Vaping Use   Vaping Use: Never used  Substance and Sexual Activity   Alcohol use: Yes    Comment: socially   Drug use: Never   Sexual activity: Not on file  Other Topics Concern   Not on file  Social History Narrative   Not on file   Social Determinants of Health   Financial Resource Strain: Not on file  Food Insecurity: No Food Insecurity   Worried About Running Out of Food in the Last Year: Never true   Ran Out of Food in the Last Year: Never true  Transportation Needs: No Transportation Needs  Lack of Transportation (Medical): No  ° Lack of Transportation (Non-Medical): No  °Physical Activity: Not on file  °Stress: Not on file  °Social Connections: Not on file  °Intimate Partner Violence: Not on file  ° ° °Outpatient Medications Prior to Visit  °Medication Sig Dispense Refill  ° acetaminophen (TYLENOL) 500 MG tablet Take 500 mg by mouth every 6 (six) hours as needed.    ° Multiple Vitamin (MULTIVITAMIN) capsule Take 1 capsule by mouth daily.    ° amLODipine (NORVASC) 5 MG tablet TAKE ONE TABLET BY MOUTH ONCE EVERY DAY (need appointment for future refills). 90 tablet 0  ° atorvastatin (LIPITOR) 80 MG tablet TAKE 1 TABLET BY MOUTH ONCE DAILY. 30 tablet 2  ° losartan (COZAAR) 50 MG tablet TAKE ONE TABLET BY MOUTH ONCE EVERY DAY 90 tablet 0  ° metoprolol succinate (TOPROL-XL) 25 MG 24 hr tablet TAKE 1 TABLET BY MOUTH ONCE DAILY. 90 tablet 0  ° omeprazole (PRILOSEC) 20 MG capsule Take 1 capsule (20 mg total) by mouth once daily. 90 capsule 0  ° °No facility-administered medications prior to  visit.  ° ° °Allergies  °Allergen Reactions  ° Rosuvastatin Rash  °  Pt is on atorvastatin with no reactions  ° ° °ROS °Review of Systems  °Constitutional: Negative.   °Eyes: Negative.   °Respiratory: Negative.    °Cardiovascular: Negative.   °Endocrine: Negative.   °Musculoskeletal:  Positive for arthralgias (bilateral knee pain due to arthritis).  °Neurological: Negative.   ° °  °Objective:  °  °Physical Exam °HENT:  °   Head: Normocephalic and atraumatic.  °   Mouth/Throat:  °   Mouth: Mucous membranes are moist.  °Eyes:  °   Extraocular Movements: Extraocular movements intact.  °   Conjunctiva/sclera: Conjunctivae normal.  °   Pupils: Pupils are equal, round, and reactive to light.  °Cardiovascular:  °   Rate and Rhythm: Normal rate and regular rhythm.  °   Pulses: Normal pulses.  °   Heart sounds: Normal heart sounds.  °Pulmonary:  °   Effort: Pulmonary effort is normal.  °   Breath sounds: Normal breath sounds.  °Musculoskeletal:     °   General: Normal range of motion.  °Neurological:  °   General: No focal deficit present.  °   Mental Status: He is alert and oriented to person, place, and time. Mental status is at baseline.  °Psychiatric:     °   Mood and Affect: Mood normal.     °   Behavior: Behavior normal.     °   Thought Content: Thought content normal.     °   Judgment: Judgment normal.  ° ° °BP 136/89 (BP Location: Left Arm, Patient Position: Sitting, Cuff Size: Large)    Pulse 78    Temp (!) 97.5 °F (36.4 °C) (Oral)    Resp 16    Ht 5' 11" (1.803 m)    Wt (!) 350 lb 9.6 oz (159 kg)    SpO2 95%    BMI 48.90 kg/m²  °Wt Readings from Last 3 Encounters:  °05/03/21 (!) 350 lb 9.6 oz (159 kg)  °03/31/21 (!) 339 lb 6.4 oz (154 kg)  °01/22/20 (!) 340 lb (154.2 kg)  ° °Encouraged weight loss ° °Health Maintenance Due  °Topic Date Due  ° COVID-19 Vaccine (1) Never done  ° HIV Screening  Never done  ° Hepatitis C Screening  Never done  ° TETANUS/TDAP  Never done  ° ° °  There are no preventive care reminders to  display for this patient. ° °No results found for: TSH °Lab Results  °Component Value Date  ° WBC 8.1 03/31/2021  ° HGB 15.2 03/31/2021  ° HCT 42.9 03/31/2021  ° MCV 84 03/31/2021  ° PLT 315 03/31/2021  ° °Lab Results  °Component Value Date  ° NA 138 03/31/2021  ° K 4.6 03/31/2021  ° CO2 17 (L) 03/31/2021  ° GLUCOSE 104 (H) 03/31/2021  ° BUN 18 03/31/2021  ° CREATININE 0.88 03/31/2021  ° BILITOT 0.5 03/31/2021  ° ALKPHOS 122 (H) 03/31/2021  ° AST 21 03/31/2021  ° ALT 21 03/31/2021  ° PROT 7.7 03/31/2021  ° ALBUMIN 4.6 03/31/2021  ° CALCIUM 9.9 03/31/2021  ° ANIONGAP 6 08/18/2019  ° EGFR 111 03/31/2021  ° °Lab Results  °Component Value Date  ° CHOL 203 (H) 03/31/2021  ° °Lab Results  °Component Value Date  ° HDL 30 (L) 03/31/2021  ° °Lab Results  °Component Value Date  ° LDLCALC 101 (H) 03/31/2021  ° °Lab Results  °Component Value Date  ° TRIG 424 (H) 03/31/2021  ° °Lab Results  °Component Value Date  ° CHOLHDL 6.8 (H) 03/31/2021  ° °Lab Results  °Component Value Date  ° HGBA1C 5.2 03/31/2021  ° ° °  °Assessment & Plan:  ° ° ° °1. History of elevated lipids °-He will continue his current medication, advised to continue on low-fat/cholesterol diet and exercise as tolerated. °- atorvastatin (LIPITOR) 80 MG tablet; TAKE 1 TABLET BY MOUTH ONCE DAILY.  Dispense: 90 tablet; Refill: 1 ° °2. Essential hypertension °-His blood pressure is under control his goal should be less than 140/90, he will continue on current medication, DASH diet. °- amLODipine (NORVASC) 5 MG tablet; TAKE ONE TABLET BY MOUTH ONCE EVERY DAY (need appointment for future refills).  Dispense: 90 tablet; Refill: 1 °- losartan (COZAAR) 50 MG tablet; TAKE ONE TABLET BY MOUTH ONCE EVERY DAY  Dispense: 90 tablet; Refill: 1 °- metoprolol succinate (TOPROL-XL) 25 MG 24 hr tablet; TAKE 1 TABLET BY MOUTH ONCE DAILY.  Dispense: 90 tablet; Refill: 1 °- EKG 12-Lead ° °3. History of gastroesophageal reflux (GERD) °-His acid reflux is under control and he will continue  on current medication. °-Avoid spicy, fatty and fried food °-Avoid sodas and sour juices °-Avoid heavy meals °-Avoid eating 4 hours before bedtime °-Elevate head of bed at night °- omeprazole (PRILOSEC) 20 MG capsule; Take 1 capsule (20 mg total) by mouth once daily.  Dispense: 90 capsule; Refill: 1 ° °4. Pain in both knees, unspecified chronicity °-He will follow-up with Dr. Kernodle for bilateral knee pain. ° °5. Morbid obesity (HCC) °-He was encouraged to cut down on soda, portion control and exercise as tolerated. ° °Follow-up: Return in about 3 months (around 07/28/2021), or if symptoms worsen or fail to improve.  ° ° °[name], NP °

## 2021-05-05 ENCOUNTER — Telehealth: Payer: Self-pay | Admitting: Pharmacy Technician

## 2021-05-05 NOTE — Telephone Encounter (Signed)
Received updated proof of income.  Patient eligible to receive medication assistance at [facility] until time for re-certification in 2024, and as long as eligibility requirements continue to be met. ? ?Keeon Zurn J. Felisa Zechman ?Care Manager ?[facility]  ?

## 2021-05-30 ENCOUNTER — Other Ambulatory Visit: Payer: Self-pay

## 2021-05-31 ENCOUNTER — Other Ambulatory Visit: Payer: Self-pay

## 2021-06-27 ENCOUNTER — Other Ambulatory Visit: Payer: Self-pay

## 2021-07-07 ENCOUNTER — Other Ambulatory Visit: Payer: Self-pay

## 2021-07-12 ENCOUNTER — Ambulatory Visit: Payer: Self-pay | Admitting: Gerontology

## 2021-07-12 ENCOUNTER — Other Ambulatory Visit: Payer: Self-pay

## 2021-07-12 ENCOUNTER — Encounter: Payer: Self-pay | Admitting: Gerontology

## 2021-07-12 VITALS — BP 109/75 | HR 85 | Temp 97.7°F | Resp 17 | Ht 70.0 in | Wt 356.0 lb

## 2021-07-12 DIAGNOSIS — M25561 Pain in right knee: Secondary | ICD-10-CM

## 2021-07-12 DIAGNOSIS — I10 Essential (primary) hypertension: Secondary | ICD-10-CM

## 2021-07-12 NOTE — Patient Instructions (Signed)

## 2021-07-12 NOTE — Progress Notes (Signed)
? ?Established Patient Office Visit ? ?Subjective   ?Patient ID: Logan Ball, male    DOB: 1980/09/10  Age: 41 y.o. MRN: [id number] ? ?Chief Complaint  ?Patient presents with  ? Follow-up  ? ? ?HPI ?Logan Ball is a 41 y/o male who has history of hypertension, hyperlipidemia, acid reflux, presents for lab review and medication refill.  He states that he is compliant with his medication, denies side effects and continues to make healthy lifestyle changes. Currently, he continues to c/o  non traumatic, intermittent non radiating pain to bilateral knee that is chronic. He continues to go to Physical therapy at [facility] for his left shoulder stiffness. Overall, he states that he's doing well and offers no further complaint. ? ? ?Review of Systems  ?Constitutional: Negative.   ?Respiratory: Negative.    ?Cardiovascular: Negative.   ?Gastrointestinal: Negative.   ?Musculoskeletal:   ?     Bilateral knee pain  ?Neurological: Negative.   ? ?  ?Objective:  ?  ? ?BP 109/75 (BP Location: Left Arm, Patient Position: Sitting, Cuff Size: Large)   Pulse 85   Temp 97.7 ?F (36.5 ?C) (Oral)   Resp 17   Ht 5' 10"  (1.778 m)   Wt (!) 356 lb (161.5 kg)   SpO2 95%   BMI 51.08 kg/m?  ?BP Readings from Last 3 Encounters:  ?07/12/21 109/75  ?05/03/21 136/89  ?03/31/21 (!) 142/83  ? ?Wt Readings from Last 3 Encounters:  ?07/12/21 (!) 356 lb (161.5 kg)  ?05/03/21 (!) 350 lb 9.6 oz (159 kg)  ?03/31/21 (!) 339 lb 6.4 oz (154 kg)  ? ? Encouraged weight loss ? ?Physical Exam ?HENT:  ?   Head: Normocephalic and atraumatic.  ?   Mouth/Throat:  ?   Mouth: Mucous membranes are moist.  ?Eyes:  ?   Extraocular Movements: Extraocular movements intact.  ?   Conjunctiva/sclera: Conjunctivae normal.  ?   Pupils: Pupils are equal, round, and reactive to light.  ?Cardiovascular:  ?   Rate and Rhythm: Normal rate and regular rhythm.  ?   Pulses: Normal pulses.  ?   Heart sounds: Normal heart sounds.  ?Pulmonary:  ?   Effort: Pulmonary effort is  normal.  ?   Breath sounds: Normal breath sounds.  ?Musculoskeletal:     ?   General: Normal range of motion.  ?Skin: ?   General: Skin is warm.  ?Neurological:  ?   General: No focal deficit present.  ?   Mental Status: He is alert and oriented to person, place, and time. Mental status is at baseline.  ?Psychiatric:     ?   Mood and Affect: Mood normal.     ?   Behavior: Behavior normal.     ?   Thought Content: Thought content normal.     ?   Judgment: Judgment normal.  ? ? ? ?No results found for any visits on 07/12/21. ? ?Last CBC ?Lab Results  ?Component Value Date  ? WBC 8.1 03/31/2021  ? HGB 15.2 03/31/2021  ? HCT 42.9 03/31/2021  ? MCV 84 03/31/2021  ? MCH 29.6 03/31/2021  ? RDW 12.3 03/31/2021  ? PLT 315 03/31/2021  ? ?Last metabolic panel ?Lab Results  ?Component Value Date  ? GLUCOSE 104 (H) 03/31/2021  ? NA 138 03/31/2021  ? K 4.6 03/31/2021  ? CL 105 03/31/2021  ? CO2 17 (L) 03/31/2021  ? BUN 18 03/31/2021  ? CREATININE 0.88 03/31/2021  ? EGFR 111 03/31/2021  ?  CALCIUM 9.9 03/31/2021  ? PROT 7.7 03/31/2021  ? ALBUMIN 4.6 03/31/2021  ? LABGLOB 3.1 03/31/2021  ? AGRATIO 1.5 03/31/2021  ? BILITOT 0.5 03/31/2021  ? ALKPHOS 122 (H) 03/31/2021  ? AST 21 03/31/2021  ? ALT 21 03/31/2021  ? ANIONGAP 6 08/18/2019  ? ?Last lipids ?Lab Results  ?Component Value Date  ? CHOL 203 (H) 03/31/2021  ? HDL 30 (L) 03/31/2021  ? LDLCALC 101 (H) 03/31/2021  ? TRIG 424 (H) 03/31/2021  ? CHOLHDL 6.8 (H) 03/31/2021  ? ?Last hemoglobin A1c ?Lab Results  ?Component Value Date  ? HGBA1C 5.2 03/31/2021  ? ?Last thyroid functions ?No results found for: TSH, T3TOTAL, T4TOTAL, THYROIDAB ?Last vitamin D ?No results found for: 25OHVITD2, [place], VD25OH ?Last vitamin B12 and Folate ?No results found for: VITAMINB12, FOLATE ?  ? ?The 10-year ASCVD risk score (Arnett DK, et al., 2019) is: 2.6% ? ?  ?Assessment & Plan:  ? ?1. Pain in both knees, unspecified chronicity ?-He was encouraged to take 500 mg Tylenol every 6 hours as needed for  pain, not to exceed 4000 mg in 24 hours.  He will follow-up with Dr. Jefm Bryant at [facility] clinic. ? ?2. Essential hypertension ?-His blood pressure is under control and he will continue current medication.  He was encouraged to continue on DASH diet and exercise as tolerated. ? ?3. Morbid obesity ([place]) ?-He was encouraged to lose weight, continue on portion sizes and low calorie diet. ? ? ?Return in about 3 months (around 10/20/2021), or if symptoms worsen or fail to improve.  ? ? ?Erva Koke Jerold Coombe, NP ? ?

## 2021-07-27 ENCOUNTER — Ambulatory Visit: Payer: Self-pay | Admitting: Gerontology

## 2021-08-10 ENCOUNTER — Other Ambulatory Visit: Payer: Self-pay

## 2021-08-23 ENCOUNTER — Other Ambulatory Visit: Payer: Self-pay

## 2021-08-23 ENCOUNTER — Ambulatory Visit: Payer: Self-pay | Admitting: Rheumatology

## 2021-08-23 ENCOUNTER — Encounter: Payer: Self-pay | Admitting: Rheumatology

## 2021-08-23 VITALS — BP 123/83 | HR 78 | Temp 98.2°F | Resp 17 | Ht 70.0 in | Wt 357.5 lb

## 2021-08-23 DIAGNOSIS — M170 Bilateral primary osteoarthritis of knee: Secondary | ICD-10-CM
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
## 2021-08-23 NOTE — Patient Instructions (Addendum)
If worse, will call for injection Work on weight reduction

## 2021-08-24 ENCOUNTER — Other Ambulatory Visit: Payer: Self-pay

## 2021-09-01 ENCOUNTER — Other Ambulatory Visit: Payer: Self-pay

## 2021-09-19 ENCOUNTER — Other Ambulatory Visit: Payer: Self-pay

## 2021-09-20 ENCOUNTER — Other Ambulatory Visit: Payer: Self-pay

## 2021-09-21 ENCOUNTER — Other Ambulatory Visit: Payer: Self-pay

## 2021-09-22 ENCOUNTER — Other Ambulatory Visit: Payer: Self-pay

## 2021-09-23 ENCOUNTER — Other Ambulatory Visit: Payer: Self-pay

## 2021-10-20 ENCOUNTER — Ambulatory Visit: Payer: Self-pay

## 2021-10-20 ENCOUNTER — Ambulatory Visit: Payer: Self-pay | Admitting: Gerontology

## 2021-10-27 ENCOUNTER — Other Ambulatory Visit: Payer: Self-pay

## 2021-10-27 ENCOUNTER — Ambulatory Visit: Payer: Self-pay | Admitting: Gerontology

## 2021-10-27 VITALS — BP 123/78 | HR 82 | Temp 98.1°F | Wt 367.7 lb

## 2021-10-27 DIAGNOSIS — Z8639 Personal history of other endocrine, nutritional and metabolic disease: Secondary | ICD-10-CM

## 2021-10-27 DIAGNOSIS — Z8719 Personal history of other diseases of the digestive system: Secondary | ICD-10-CM

## 2021-10-27 DIAGNOSIS — I10 Essential (primary) hypertension: Secondary | ICD-10-CM

## 2021-10-27 MED ORDER — LOSARTAN POTASSIUM 50 MG PO TABS
50.0000 mg | ORAL_TABLET | Freq: Every day | ORAL | 1 refills | Status: DC
  Filled 2021-10-27 – 2021-11-28 (×4): qty 90, 90d supply, fill #0
  Filled 2022-02-19: qty 90, 90d supply, fill #1

## 2021-10-27 MED ORDER — ATORVASTATIN CALCIUM 80 MG PO TABS
ORAL_TABLET | Freq: Every day | ORAL | 1 refills | Status: DC
  Filled 2021-10-27 – 2021-10-31 (×3): qty 90, fill #0
  Filled 2021-11-27 – 2021-12-27 (×2): qty 90, 90d supply, fill #0
  Filled 2022-02-19 – 2022-02-20 (×2): qty 90, 90d supply, fill #1

## 2021-10-27 MED ORDER — AMLODIPINE BESYLATE 5 MG PO TABS
ORAL_TABLET | ORAL | 1 refills | Status: DC
  Filled 2021-10-27: qty 90, 90d supply, fill #0
  Filled 2021-11-27 – 2022-01-22 (×2): qty 90, 90d supply, fill #1

## 2021-10-27 MED ORDER — OMEPRAZOLE 20 MG PO CPDR
20.0000 mg | DELAYED_RELEASE_CAPSULE | Freq: Every day | ORAL | 1 refills | Status: DC
  Filled 2021-10-27 – 2021-11-27 (×4): qty 90, 90d supply, fill #0
  Filled 2022-02-19: qty 90, 90d supply, fill #1

## 2021-10-27 MED ORDER — METOPROLOL SUCCINATE ER 25 MG PO TB24
ORAL_TABLET | Freq: Every day | ORAL | 1 refills | Status: DC
  Filled 2021-10-27 – 2021-10-31 (×3): qty 90, fill #0
  Filled 2021-11-27: qty 90, 90d supply, fill #0
  Filled 2022-02-19: qty 90, 90d supply, fill #1

## 2021-10-27 NOTE — Progress Notes (Signed)
Established Patient Office Visit  Subjective   Patient ID: Logan Ball, male    DOB: Sep 03, 1980  Age: 41 y.o. MRN: [id number]  Chief Complaint  Patient presents with   Follow-up    HPI  Logan D Buchanan is a 41 y/o male who has history of hypertension, hyperlipidemia, acid reflux, presents for routine follow up. He continues to see Physical Therapy for left shoulder stiffness. He states that he's compliant with his medications, denies side effects and continues to make healthy lifestyle changes. Overall, he states that he's doing well and offers no further complaint.  Review of Systems  Constitutional: Negative.   Eyes: Negative.   Respiratory: Negative.    Cardiovascular: Negative.   Skin: Negative.   Neurological: Negative.   Psychiatric/Behavioral: Negative.        Objective:     BP 123/78   Pulse 82   Temp 98.1 F (36.7 C)   Wt (!) 367 lb 11.2 oz (166.8 kg)   SpO2 95%   BMI 52.76 kg/m  BP Readings from Last 3 Encounters:  10/27/21 123/78  08/23/21 123/83  07/12/21 109/75   Wt Readings from Last 3 Encounters:  10/27/21 (!) 367 lb 11.2 oz (166.8 kg)  08/23/21 (!) 357 lb 8 oz (162.2 kg)  07/12/21 (!) 356 lb (161.5 kg)      Physical Exam HENT:     Head: Normocephalic and atraumatic.     Mouth/Throat:     Mouth: Mucous membranes are moist.  Eyes:     Extraocular Movements: Extraocular movements intact.     Conjunctiva/sclera: Conjunctivae normal.     Pupils: Pupils are equal, round, and reactive to light.  Cardiovascular:     Rate and Rhythm: Normal rate and regular rhythm.     Pulses: Normal pulses.     Heart sounds: Normal heart sounds.  Pulmonary:     Effort: Pulmonary effort is normal.     Breath sounds: Normal breath sounds.  Skin:    General: Skin is warm.  Neurological:     General: No focal deficit present.     Mental Status: He is alert and oriented to person, place, and time. Mental status is at baseline.  Psychiatric:        Mood  and Affect: Mood normal.        Behavior: Behavior normal.        Thought Content: Thought content normal.        Judgment: Judgment normal.      No results found for any visits on 10/27/21.  Last CBC Lab Results  Component Value Date   WBC 8.1 03/31/2021   HGB 15.2 03/31/2021   HCT 42.9 03/31/2021   MCV 84 03/31/2021   MCH 29.6 03/31/2021   RDW 12.3 03/31/2021   PLT 315 68/10/8108   Last metabolic panel Lab Results  Component Value Date   GLUCOSE 104 (H) 03/31/2021   NA 138 03/31/2021   K 4.6 03/31/2021   CL 105 03/31/2021   CO2 17 (L) 03/31/2021   BUN 18 03/31/2021   CREATININE 0.88 03/31/2021   EGFR 111 03/31/2021   CALCIUM 9.9 03/31/2021   PROT 7.7 03/31/2021   ALBUMIN 4.6 03/31/2021   LABGLOB 3.1 03/31/2021   AGRATIO 1.5 03/31/2021   BILITOT 0.5 03/31/2021   ALKPHOS 122 (H) 03/31/2021   AST 21 03/31/2021   ALT 21 03/31/2021   ANIONGAP 6 08/18/2019   Last lipids Lab Results  Component Value Date   CHOL  203 (H) 03/31/2021   HDL 30 (L) 03/31/2021   LDLCALC 101 (H) 03/31/2021   TRIG 424 (H) 03/31/2021   CHOLHDL 6.8 (H) 03/31/2021   Last hemoglobin A1c Lab Results  Component Value Date   HGBA1C 5.2 03/31/2021      The 10-year ASCVD risk score (Arnett DK, et al., 2019) is: 3.2%    Assessment & Plan:   1. Essential hypertension - His blood pressure is under control, he will continue current medication, DASH diet and exercise as tolerated. - amLODipine (NORVASC) 5 MG tablet; TAKE ONE TABLET BY MOUTH ONCE EVERY DAY  Dispense: 90 tablet; Refill: 1 - losartan (COZAAR) 50 MG tablet; TAKE ONE TABLET BY MOUTH ONCE EVERY DAY  Dispense: 90 tablet; Refill: 1 - metoprolol succinate (TOPROL-XL) 25 MG 24 hr tablet; TAKE 1 TABLET BY MOUTH ONCE DAILY.  Dispense: 90 tablet; Refill: 1 - Comp Met (CMET); Future - CBC w/Diff; Future - HgB A1c; Future - Urine Microalbumin w/creat. ratio; Future  2. History of elevated lipids - He will continue on current  medication, low fat/cholesterol diet and exerciser as tolerated. - atorvastatin (LIPITOR) 80 MG tablet; TAKE 1 TABLET BY MOUTH ONCE DAILY.  Dispense: 90 tablet; Refill: 1 - Lipid panel; Future  3. History of gastroesophageal reflux (GERD) - His acid reflux is under control, will continue on current medication. -Avoid spicy, fatty and fried food -Avoid sodas and sour juices -Avoid heavy meals -Avoid eating 4 hours before bedtime -Elevate head of bed at night - omeprazole (PRILOSEC) 20 MG capsule; Take 1 capsule (20 mg total) by mouth once daily.  Dispense: 90 capsule; Refill: 1   Return in about 5 months (around 04/05/2022), or if symptoms worsen or fail to improve.    Aribelle Mccosh Jerold Coombe, NP

## 2021-10-27 NOTE — Patient Instructions (Signed)
Heart-Healthy Eating Plan Eating a healthy diet is important for the health of your heart. A heart-healthy eating plan includes: Eating less unhealthy fats. Eating more healthy fats. Eating less salt in your food. Salt is also called sodium. Making other changes in your diet. Talk with your doctor or a diet specialist (dietitian) to create an eating plan that is right for you. What is my plan? Your doctor may recommend an eating plan that includes: Total fat: ______% or less of total calories a day. Saturated fat: ______% or less of total calories a day. Cholesterol: less than _________mg a day. Sodium: less than _________mg a day. What are tips for following this plan? Cooking Avoid frying your food. Try to bake, boil, grill, or broil it instead. You can also reduce fat by: Removing the skin from poultry. Removing all visible fats from meats. Steaming vegetables in water or broth. Meal planning  At meals, divide your plate into four equal parts: Fill one-half of your plate with vegetables and green salads. Fill one-fourth of your plate with whole grains. Fill one-fourth of your plate with lean protein foods. Eat 2-4 cups of vegetables per day. One cup of vegetables is: 1 cup (91 g) broccoli or cauliflower florets. 2 medium carrots. 1 large bell pepper. 1 large sweet potato. 1 large tomato. 1 medium white potato. 2 cups (150 g) raw leafy greens. Eat 1-2 cups of fruit per day. One cup of fruit is: 1 small apple 1 large banana 1 cup (237 g) mixed fruit, 1 large orange,  cup (82 g) dried fruit, 1 cup (240 mL) 100% fruit juice. Eat more foods that have soluble fiber. These are apples, broccoli, carrots, beans, peas, and barley. Try to get 20-30 g of fiber per day. Eat 4-5 servings of nuts, legumes, and seeds per week: 1 serving of dried beans or legumes equals  cup (90 g) cooked. 1 serving of nuts is  oz (12 almonds, 24 pistachios, or 7 walnut halves). 1 serving of seeds  equals  oz (8 g). General information Eat more home-cooked food. Eat less restaurant, buffet, and fast food. Limit or avoid alcohol. Limit foods that are high in starch and sugar. Avoid fried foods. Lose weight if you are overweight. Keep track of how much salt (sodium) you eat. This is important if you have high blood pressure. Ask your doctor to tell you more about this. Try to add vegetarian meals each week. Fats Choose healthy fats. These include olive oil and canola oil, flaxseeds, walnuts, almonds, and seeds. Eat more omega-3 fats. These include salmon, mackerel, sardines, tuna, flaxseed oil, and ground flaxseeds. Try to eat fish at least 2 times each week. Check food labels. Avoid foods with trans fats or high amounts of saturated fat. Limit saturated fats. These are often found in animal products, such as meats, butter, and cream. These are also found in plant foods, such as palm oil, palm kernel oil, and coconut oil. Avoid foods with partially hydrogenated oils in them. These have trans fats. Examples are stick margarine, some tub margarines, cookies, crackers, and other baked goods. What foods should I eat? Fruits All fresh, canned (in natural juice), or frozen fruits. Vegetables Fresh or frozen vegetables (raw, steamed, roasted, or grilled). Green salads. Grains Most grains. Choose whole wheat and whole grains most of the time. Rice and pasta, including brown rice and pastas made with whole wheat. Meats and other proteins Lean, well-trimmed beef, veal, pork, and lamb. Chicken and turkey without skin.   All fish and shellfish. Wild duck, rabbit, pheasant, and venison. Egg whites or low-cholesterol egg substitutes. Dried beans, peas, lentils, and tofu. Seeds and most nuts. Dairy Low-fat or nonfat cheeses, including ricotta and mozzarella. Skim or 1% milk that is liquid, powdered, or evaporated. Buttermilk that is made with low-fat milk. Nonfat or low-fat yogurt. Fats and  oils Non-hydrogenated (trans-free) margarines. Vegetable oils, including soybean, sesame, sunflower, olive, peanut, safflower, corn, canola, and cottonseed. Salad dressings or mayonnaise made with a vegetable oil. Beverages Mineral water. Coffee and tea. Diet carbonated beverages. Sweets and desserts Sherbet, gelatin, and fruit ice. Small amounts of dark chocolate. Limit all sweets and desserts. Seasonings and condiments All seasonings and condiments. The items listed above may not be a complete list of foods and drinks you can eat. Contact a dietitian for more options. What foods should I avoid? Fruits Canned fruit in heavy syrup. Fruit in cream or butter sauce. Fried fruit. Limit coconut. Vegetables Vegetables cooked in cheese, cream, or butter sauce. Fried vegetables. Grains Breads that are made with saturated or trans fats, oils, or whole milk. Croissants. Sweet rolls. Donuts. High-fat crackers, such as cheese crackers. Meats and other proteins Fatty meats, such as hot dogs, ribs, sausage, bacon, rib-eye roast or steak. High-fat deli meats, such as salami and bologna. Caviar. Domestic duck and goose. Organ meats, such as liver. Dairy Cream, sour cream, cream cheese, and creamed cottage cheese. Whole-milk cheeses. Whole or 2% milk that is liquid, evaporated, or condensed. Whole buttermilk. Cream sauce or high-fat cheese sauce. Yogurt that is made from whole milk. Fats and oils Meat fat, or shortening. Cocoa butter, hydrogenated oils, palm oil, coconut oil, palm kernel oil. Solid fats and shortenings, including bacon fat, salt pork, lard, and butter. Nondairy cream substitutes. Salad dressings with cheese or sour cream. Beverages Regular sodas and juice drinks with added sugar. Sweets and desserts Frosting. Pudding. Cookies. Cakes. Pies. Milk chocolate or white chocolate. Buttered syrups. Full-fat ice cream or ice cream drinks. The items listed above may not be a complete list of foods  and drinks to avoid. Contact a dietitian for more information. Summary Heart-healthy meal planning includes eating less unhealthy fats, eating more healthy fats, and making other changes in your diet. Eat a balanced diet. This includes fruits and vegetables, low-fat or nonfat dairy, lean protein, nuts and legumes, whole grains, and heart-healthy oils and fats. This information is not intended to replace advice given to you by your health care provider. Make sure you discuss any questions you have with your health care provider. Document Revised: 03/28/2021 Document Reviewed: 03/28/2021 Elsevier Patient Education  2023 [employer] DASH Eating Plan DASH stands for Dietary Approaches to Stop Hypertension. The DASH eating plan is a healthy eating plan that has been shown to: Reduce high blood pressure (hypertension). Reduce your risk for type 2 diabetes, heart disease, and stroke. Help with weight loss. What are tips for following this plan? Reading food labels Check food labels for the amount of salt (sodium) per serving. Choose foods with less than 5 percent of the Daily Value of sodium. Generally, foods with less than 300 milligrams (mg) of sodium per serving fit into this eating plan. To find whole grains, look for the word "whole" as the first word in the ingredient list. Shopping Buy products labeled as "low-sodium" or "no salt added." Buy fresh foods. Avoid canned foods and pre-made or frozen meals. Cooking Avoid adding salt when cooking. Use salt-free seasonings or herbs instead of table salt   or sea salt. Check with your health care provider or pharmacist before using salt substitutes. Do not fry foods. Banke foods using healthy methods such as baking, boiling, grilling, roasting, and broiling instead. Faughn with heart-healthy oils, such as olive, canola, avocado, soybean, or sunflower oil. Meal planning  Eat a balanced diet that includes: 4 or more servings of fruits and 4 or more  servings of vegetables each day. Try to fill one-half of your plate with fruits and vegetables. 6-8 servings of whole grains each day. Less than 6 oz (170 g) of lean meat, poultry, or fish each day. A 3-oz (85-g) serving of meat is about the same size as a deck of cards. One egg equals 1 oz (28 g). 2-3 servings of low-fat dairy each day. One serving is 1 cup (237 mL). 1 serving of nuts, seeds, or beans 5 times each week. 2-3 servings of heart-healthy fats. Healthy fats called omega-3 fatty acids are found in foods such as walnuts, flaxseeds, fortified milks, and eggs. These fats are also found in cold-water fish, such as sardines, salmon, and mackerel. Limit how much you eat of: Canned or prepackaged foods. Food that is high in trans fat, such as some fried foods. Food that is high in saturated fat, such as fatty meat. Desserts and other sweets, sugary drinks, and other foods with added sugar. Full-fat dairy products. Do not salt foods before eating. Do not eat more than 4 egg yolks a week. Try to eat at least 2 vegetarian meals a week. Eat more home-cooked food and less restaurant, buffet, and fast food. Lifestyle When eating at a restaurant, ask that your food be prepared with less salt or no salt, if possible. If you drink alcohol: Limit how much you use to: 0-1 drink a day for women who are not pregnant. 0-2 drinks a day for men. Be aware of how much alcohol is in your drink. In the [place] one drink equals one 12 oz bottle of beer (355 mL), one 5 oz glass of wine (148 mL), or one 1 oz glass of hard liquor (44 mL). General information Avoid eating more than 2,300 mg of salt a day. If you have hypertension, you may need to reduce your sodium intake to 1,500 mg a day. Work with your health care provider to maintain a healthy body weight or to lose weight. Ask what an ideal weight is for you. Get at least 30 minutes of exercise that causes your heart to beat faster (aerobic exercise) most  days of the week. Activities may include walking, swimming, or biking. Work with your health care provider or dietitian to adjust your eating plan to your individual calorie needs. What foods should I eat? Fruits All fresh, dried, or frozen fruit. Canned fruit in natural juice (without added sugar). Vegetables Fresh or frozen vegetables (raw, steamed, roasted, or grilled). Low-sodium or reduced-sodium tomato and vegetable juice. Low-sodium or reduced-sodium tomato sauce and tomato paste. Low-sodium or reduced-sodium canned vegetables. Grains Whole-grain or whole-wheat bread. Whole-grain or whole-wheat pasta. Brown rice. Oatmeal. Quinoa. Bulgur. Whole-grain and low-sodium cereals. Pita bread. Low-fat, low-sodium crackers. Whole-wheat flour tortillas. Meats and other proteins Skinless chicken or turkey. Ground chicken or turkey. Pork with fat trimmed off. Fish and seafood. Egg whites. Dried beans, peas, or lentils. Unsalted nuts, nut butters, and seeds. Unsalted canned beans. Lean cuts of beef with fat trimmed off. Low-sodium, lean precooked or cured meat, such as sausages or meat loaves. Dairy Low-fat (1%) or fat-free (skim) milk.   Reduced-fat, low-fat, or fat-free cheeses. Nonfat, low-sodium ricotta or cottage cheese. Low-fat or nonfat yogurt. Low-fat, low-sodium cheese. Fats and oils Soft margarine without trans fats. Vegetable oil. Reduced-fat, low-fat, or light mayonnaise and salad dressings (reduced-sodium). Canola, safflower, olive, avocado, soybean, and sunflower oils. Avocado. Seasonings and condiments Herbs. Spices. Seasoning mixes without salt. Other foods Unsalted popcorn and pretzels. Fat-free sweets. The items listed above may not be a complete list of foods and beverages you can eat. Contact a dietitian for more information. What foods should I avoid? Fruits Canned fruit in a light or heavy syrup. Fried fruit. Fruit in cream or butter sauce. Vegetables Creamed or fried vegetables.  Vegetables in a cheese sauce. Regular canned vegetables (not low-sodium or reduced-sodium). Regular canned tomato sauce and paste (not low-sodium or reduced-sodium). Regular tomato and vegetable juice (not low-sodium or reduced-sodium). Rosita Fire. Olives. Grains Baked goods made with fat, such as croissants, muffins, or some breads. Dry pasta or rice meal packs. Meats and other proteins Fatty cuts of meat. Ribs. Fried meat. Tomasa Blase. Bologna, salami, and other precooked or cured meats, such as sausages or meat loaves. Fat from the back of a pig (fatback). Bratwurst. Salted nuts and seeds. Canned beans with added salt. Canned or smoked fish. Whole eggs or egg yolks. Chicken or [place] with skin. Dairy Whole or 2% milk, cream, and half-and-half. Whole or full-fat cream cheese. Whole-fat or sweetened yogurt. Full-fat cheese. Nondairy creamers. Whipped toppings. Processed cheese and cheese spreads. Fats and oils Butter. Stick margarine. Lard. Shortening. Ghee. Bacon fat. Tropical oils, such as coconut, palm kernel, or palm oil. Seasonings and condiments Onion salt, garlic salt, seasoned salt, table salt, and sea salt. Worcestershire sauce. Tartar sauce. Barbecue sauce. Teriyaki sauce. Soy sauce, including reduced-sodium. Steak sauce. Canned and packaged gravies. Fish sauce. Oyster sauce. Cocktail sauce. Store-bought horseradish. Ketchup. Mustard. Meat flavorings and tenderizers. Bouillon cubes. Hot sauces. Pre-made or packaged marinades. Pre-made or packaged taco seasonings. Relishes. Regular salad dressings. Other foods Salted popcorn and pretzels. The items listed above may not be a complete list of foods and beverages you should avoid. Contact a dietitian for more information. Where to find more information National Heart, Lung, and Blood Institute: PopSteam.is American Heart Association: www.heart.org Academy of Nutrition and Dietetics: www.eatright.org National Kidney Foundation:  www.kidney.org Summary The DASH eating plan is a healthy eating plan that has been shown to reduce high blood pressure (hypertension). It may also reduce your risk for type 2 diabetes, heart disease, and stroke. When on the DASH eating plan, aim to eat more fresh fruits and vegetables, whole grains, lean proteins, low-fat dairy, and heart-healthy fats. With the DASH eating plan, you should limit salt (sodium) intake to 2,300 mg a day. If you have hypertension, you may need to reduce your sodium intake to 1,500 mg a day. Work with your health care provider or dietitian to adjust your eating plan to your individual calorie needs. This information is not intended to replace advice given to you by your health care provider. Make sure you discuss any questions you have with your health care provider. Document Revised: 01/24/2019 Document Reviewed: 01/24/2019 Elsevier Patient Education  2023 Else[employer] Inc. Calorie Counting for [employer] Loss Calories are units of energy. Your body needs a certain number of calories from food to keep going throughout the day. When you eat or drink more calories than your body needs, your body stores the extra calories mostly as fat. When you eat or drink fewer calories than your body needs, your  body burns fat to get the energy it needs. Calorie counting means keeping track of how many calories you eat and drink each day. Calorie counting can be helpful if you need to lose weight. If you eat fewer calories than your body needs, you should lose weight. Ask your health care provider what a healthy weight is for you. For calorie counting to work, you will need to eat the right number of calories each day to lose a healthy amount of weight per week. A dietitian can help you figure out how many calories you need in a day and will suggest ways to reach your calorie goal. A healthy amount of weight to lose each week is usually 1-2 lb (0.5-0.9 kg). This usually means that your daily  calorie intake should be reduced by 500-750 calories. Eating 1,200-1,500 calories a day can help most women lose weight. Eating 1,500-1,800 calories a day can help most men lose weight. What do I need to know about calorie counting? Work with your health care provider or dietitian to determine how many calories you should get each day. To meet your daily calorie goal, you will need to: Find out how many calories are in each food that you would like to eat. Try to do this before you eat. Decide how much of the food you plan to eat. Keep a food log. Do this by writing down what you ate and how many calories it had. To successfully lose weight, it is important to balance calorie counting with a healthy lifestyle that includes regular activity. Where do I find calorie information?  The number of calories in a food can be found on a Nutrition Facts label. If a food does not have a Nutrition Facts label, try to look up the calories online or ask your dietitian for help. Remember that calories are listed per serving. If you choose to have more than one serving of a food, you will have to multiply the calories per serving by the number of servings you plan to eat. For example, the label on a package of bread might say that a serving size is 1 slice and that there are 90 calories in a serving. If you eat 1 slice, you will have eaten 90 calories. If you eat 2 slices, you will have eaten 180 calories. How do I keep a food log? After each time that you eat, record the following in your food log as soon as possible: What you ate. Be sure to include toppings, sauces, and other extras on the food. How much you ate. This can be measured in cups, ounces, or number of items. How many calories were in each food and drink. The total number of calories in the food you ate. Keep your food log near you, such as in a pocket-sized notebook or on an app or website on your mobile phone. Some programs will calculate calories  for you and show you how many calories you have left to meet your daily goal. What are some portion-control tips? Know how many calories are in a serving. This will help you know how many servings you can have of a certain food. Use a measuring cup to measure serving sizes. You could also try weighing out portions on a kitchen scale. With time, you will be able to estimate serving sizes for some foods. Take time to put servings of different foods on your favorite plates or in your favorite bowls and cups so you know what a  serving looks like. Try not to eat straight from a food's packaging, such as from a bag or box. Eating straight from the package makes it hard to see how much you are eating and can lead to overeating. Put the amount you would like to eat in a cup or on a plate to make sure you are eating the right portion. Use smaller plates, glasses, and bowls for smaller portions and to prevent overeating. Try not to multitask. For example, avoid watching TV or using your computer while eating. If it is time to eat, sit down at a table and enjoy your food. This will help you recognize when you are full. It will also help you be more mindful of what and how much you are eating. What are tips for following this plan? Reading food labels Check the calorie count compared with the serving size. The serving size may be smaller than what you are used to eating. Check the source of the calories. Try to choose foods that are high in protein, fiber, and vitamins, and low in saturated fat, trans fat, and sodium. Shopping Read nutrition labels while you shop. This will help you make healthy decisions about which foods to buy. Pay attention to nutrition labels for low-fat or fat-free foods. These foods sometimes have the same number of calories or more calories than the full-fat versions. They also often have added sugar, starch, or salt to make up for flavor that was removed with the fat. Make a grocery list  of lower-calorie foods and stick to it. Cooking Try to cook your favorite foods in a healthier way. For example, try baking instead of frying. Use low-fat dairy products. Meal planning Use more fruits and vegetables. One-half of your plate should be fruits and vegetables. Include lean proteins, such as chicken, [place], and fish. Lifestyle Each week, aim to do one of the following: 150 minutes of moderate exercise, such as walking. 75 minutes of vigorous exercise, such as running. General information Know how many calories are in the foods you eat most often. This will help you calculate calorie counts faster. Find a way of tracking calories that works for you. Get creative. Try different apps or programs if writing down calories does not work for you. What foods should I eat?  Eat nutritious foods. It is better to have a nutritious, high-calorie food, such as an avocado, than a food with few nutrients, such as a bag of potato chips. Use your calories on foods and drinks that will fill you up and will not leave you hungry soon after eating. Examples of foods that fill you up are nuts and nut butters, vegetables, lean proteins, and high-fiber foods such as whole grains. High-fiber foods are foods with more than 5 g of fiber per serving. Pay attention to calories in drinks. Low-calorie drinks include water and unsweetened drinks. The items listed above may not be a complete list of foods and beverages you can eat. Contact a dietitian for more information. What foods should I limit? Limit foods or drinks that are not good sources of vitamins, minerals, or protein or that are high in unhealthy fats. These include: Candy. Other sweets. Sodas, specialty coffee drinks, alcohol, and juice. The items listed above may not be a complete list of foods and beverages you should avoid. Contact a dietitian for more information. How do I count calories when eating out? Pay attention to portions. Often,  portions are much larger when eating out. Try these tips  to keep portions smaller: Consider sharing a meal instead of getting your own. If you get your own meal, eat only half of it. Before you start eating, ask for a container and put half of your meal into it. When available, consider ordering smaller portions from the menu instead of full portions. Pay attention to your food and drink choices. Knowing the way food is cooked and what is included with the meal can help you eat fewer calories. If calories are listed on the menu, choose the lower-calorie options. Choose dishes that include vegetables, fruits, whole grains, low-fat dairy products, and lean proteins. Choose items that are boiled, broiled, grilled, or steamed. Avoid items that are buttered, battered, fried, or served with cream sauce. Items labeled as crispy are usually fried, unless stated otherwise. Choose water, low-fat milk, unsweetened iced tea, or other drinks without added sugar. If you want an alcoholic beverage, choose a lower-calorie option, such as a glass of wine or light beer. Ask for dressings, sauces, and syrups on the side. These are usually high in calories, so you should limit the amount you eat. If you want a salad, choose a garden salad and ask for grilled meats. Avoid extra toppings such as bacon, cheese, or fried items. Ask for the dressing on the side, or ask for olive oil and vinegar or lemon to use as dressing. Estimate how many servings of a food you are given. Knowing serving sizes will help you be aware of how much food you are eating at restaurants. Where to find more information Centers for Disease Control and Prevention: FootballExhibition.com.br [place]. Department of Agriculture: WrestlingReporter.dk Summary Calorie counting means keeping track of how many calories you eat and drink each day. If you eat fewer calories than your body needs, you should lose weight. A healthy amount of weight to lose per week is usually 1-2 lb  (0.5-0.9 kg). This usually means reducing your daily calorie intake by 500-750 calories. The number of calories in a food can be found on a Nutrition Facts label. If a food does not have a Nutrition Facts label, try to look up the calories online or ask your dietitian for help. Use smaller plates, glasses, and bowls for smaller portions and to prevent overeating. Use your calories on foods and drinks that will fill you up and not leave you hungry shortly after a meal. This information is not intended to replace advice given to you by your health care provider. Make sure you discuss any questions you have with your health care provider. Document Revised: 04/03/2019 Document Reviewed: 04/03/2019 Elsevier Patient Education  2023 [employer].

## 2021-10-30 ENCOUNTER — Other Ambulatory Visit: Payer: Self-pay

## 2021-10-31 ENCOUNTER — Other Ambulatory Visit: Payer: Self-pay

## 2021-11-27 ENCOUNTER — Other Ambulatory Visit: Payer: Self-pay

## 2021-11-28 ENCOUNTER — Other Ambulatory Visit: Payer: Self-pay

## 2021-11-29 ENCOUNTER — Other Ambulatory Visit: Payer: Self-pay

## 2021-12-27 ENCOUNTER — Other Ambulatory Visit: Payer: Self-pay

## 2022-01-23 ENCOUNTER — Other Ambulatory Visit: Payer: Self-pay

## 2022-02-03 ENCOUNTER — Other Ambulatory Visit: Payer: Self-pay

## 2022-02-19 ENCOUNTER — Other Ambulatory Visit: Payer: Self-pay

## 2022-02-20 ENCOUNTER — Other Ambulatory Visit: Payer: Self-pay

## 2022-02-23 ENCOUNTER — Other Ambulatory Visit: Payer: Self-pay

## 2022-02-24 ENCOUNTER — Other Ambulatory Visit: Payer: Self-pay

## 2022-03-10 ENCOUNTER — Other Ambulatory Visit: Payer: Self-pay

## 2022-03-29 ENCOUNTER — Other Ambulatory Visit: Payer: Self-pay

## 2022-03-29 DIAGNOSIS — Z8639 Personal history of other endocrine, nutritional and metabolic disease: Secondary | ICD-10-CM

## 2022-03-29 DIAGNOSIS — I10 Essential (primary) hypertension: Secondary | ICD-10-CM

## 2022-03-30 LAB — COMPREHENSIVE METABOLIC PANEL
ALT: 26 IU/L (ref 0–44)
AST: 30 IU/L (ref 0–40)
Albumin/Globulin Ratio: 1.6 (ref 1.2–2.2)
Albumin: 4.4 g/dL (ref 4.1–5.1)
Alkaline Phosphatase: 110 IU/L (ref 44–121)
BUN/Creatinine Ratio: 14 (ref 9–20)
BUN: 13 mg/dL (ref 6–24)
Bilirubin Total: 0.6 mg/dL (ref 0.0–1.2)
CO2: 20 mmol/L (ref 20–29)
Calcium: 9.5 mg/dL (ref 8.7–10.2)
Chloride: 106 mmol/L (ref 96–106)
Creatinine, Ser: 0.92 mg/dL (ref 0.76–1.27)
Globulin, Total: 2.7 g/dL (ref 1.5–4.5)
Glucose: 95 mg/dL (ref 70–99)
Potassium: 4.4 mmol/L (ref 3.5–5.2)
Sodium: 140 mmol/L (ref 134–144)
Total Protein: 7.1 g/dL (ref 6.0–8.5)
eGFR: 107 mL/min/{1.73_m2} (ref >59)

## 2022-03-30 LAB — CBC WITH DIFFERENTIAL/PLATELET
Basophils Absolute: 0 10*3/uL (ref 0.0–0.2)
Basos: 0 %
EOS (ABSOLUTE): 0.2 10*3/uL (ref 0.0–0.4)
Eos: 3 %
Hematocrit: 42.5 % (ref 37.5–51.0)
Hemoglobin: 14.8 g/dL (ref 13.0–17.7)
Immature Grans (Abs): 0 10*3/uL (ref 0.0–0.1)
Immature Granulocytes: 0 %
Lymphocytes Absolute: 1.2 10*3/uL (ref 0.7–3.1)
Lymphs: 22 %
MCH: 28.7 pg (ref 26.6–33.0)
MCHC: 34.8 g/dL (ref 31.5–35.7)
MCV: 83 fL (ref 79–97)
Monocytes Absolute: 0.4 10*3/uL (ref 0.1–0.9)
Monocytes: 8 %
Neutrophils Absolute: 3.8 10*3/uL (ref 1.4–7.0)
Neutrophils: 67 %
Platelets: 276 10*3/uL (ref 150–450)
RBC: 5.15 x10E6/uL (ref 4.14–5.80)
RDW: 13 % (ref 11.6–15.4)
WBC: 5.6 10*3/uL (ref 3.4–10.8)

## 2022-03-30 LAB — LIPID PANEL
Chol/HDL Ratio: 6.2 ratio — ABNORMAL HIGH (ref 0.0–5.0)
Cholesterol, Total: 191 mg/dL (ref 100–199)
HDL: 31 mg/dL — ABNORMAL LOW (ref >39)
LDL Chol Calc (NIH): 93 mg/dL (ref 0–99)
Triglycerides: 400 mg/dL — ABNORMAL HIGH (ref 0–149)
VLDL Cholesterol Cal: 67 mg/dL — ABNORMAL HIGH (ref 5–40)

## 2022-03-30 LAB — HEMOGLOBIN A1C
Est. average glucose Bld gHb Est-mCnc: 100 mg/dL
Hgb A1c MFr Bld: 5.1 % (ref 4.8–5.6)

## 2022-04-05 ENCOUNTER — Other Ambulatory Visit: Payer: Self-pay

## 2022-04-05 ENCOUNTER — Ambulatory Visit: Payer: Self-pay | Admitting: Gerontology

## 2022-04-05 ENCOUNTER — Encounter: Payer: Self-pay | Admitting: Gerontology

## 2022-04-05 VITALS — BP 135/84 | HR 81 | Temp 97.7°F | Resp 16 | Ht 70.0 in | Wt 366.4 lb

## 2022-04-05 DIAGNOSIS — E781 Pure hyperglyceridemia: Secondary | ICD-10-CM

## 2022-04-05 DIAGNOSIS — Z8639 Personal history of other endocrine, nutritional and metabolic disease: Secondary | ICD-10-CM

## 2022-04-05 DIAGNOSIS — Z8719 Personal history of other diseases of the digestive system: Secondary | ICD-10-CM

## 2022-04-05 DIAGNOSIS — I10 Essential (primary) hypertension: Secondary | ICD-10-CM

## 2022-04-05 MED ORDER — LOSARTAN POTASSIUM 50 MG PO TABS
50.0000 mg | ORAL_TABLET | Freq: Every day | ORAL | 1 refills | Status: DC
  Filled 2022-04-05 – 2022-05-25 (×2): qty 90, 90d supply, fill #0
  Filled 2022-08-21 (×2): qty 90, 90d supply, fill #1

## 2022-04-05 MED ORDER — METOPROLOL SUCCINATE ER 25 MG PO TB24
25.0000 mg | ORAL_TABLET | Freq: Every day | ORAL | 1 refills | Status: DC
  Filled 2022-04-05: qty 90, fill #0
  Filled 2022-04-13 – 2022-04-27 (×4): qty 90, 90d supply, fill #0
  Filled 2022-07-23 (×2): qty 90, 90d supply, fill #1

## 2022-04-05 MED ORDER — OMEPRAZOLE 20 MG PO CPDR
20.0000 mg | DELAYED_RELEASE_CAPSULE | Freq: Every day | ORAL | 1 refills | Status: DC
  Filled 2022-04-05 – 2022-05-25 (×2): qty 90, 90d supply, fill #0
  Filled 2022-08-21: qty 90, 90d supply, fill #1

## 2022-04-05 MED ORDER — AMLODIPINE BESYLATE 5 MG PO TABS
5.0000 mg | ORAL_TABLET | Freq: Every day | ORAL | 1 refills | Status: DC
  Filled 2022-04-05: qty 90, fill #0
  Filled 2022-04-13 – 2022-04-27 (×4): qty 90, 90d supply, fill #0
  Filled 2022-07-23: qty 90, 90d supply, fill #1

## 2022-04-05 MED ORDER — ATORVASTATIN CALCIUM 80 MG PO TABS
80.0000 mg | ORAL_TABLET | Freq: Every day | ORAL | 1 refills | Status: DC
  Filled 2022-04-05: qty 90, fill #0
  Filled 2022-04-13 – 2022-04-27 (×4): qty 90, 90d supply, fill #0
  Filled 2022-07-23: qty 90, 90d supply, fill #1

## 2022-04-05 NOTE — Progress Notes (Signed)
Established Patient Office Visit  Subjective   Patient ID: Logan Ball, male    DOB: 12-01-1980  Age: 42 y.o. MRN: [id number]  Chief Complaint  Patient presents with   Follow-up    Labs drawn 03/29/22   Hypertension    HPI: Logan Ball is a 42 y/o male who has history of hypertension, hyperlipidemia, acid reflux, presents for routine follow up and lab review.  His Lipid panel done on 03/29/22, Triglycerides decreased from 424 to 400 mg/dl, and HDL was 31 mg/dl, the rest of her labs are within normal limits. He states that he's compliant with his medications, and denies side effects. He states that he does not check his blood pressure at home and does not have a machine. He reports that he eats whatever he wants and doesn't follow an overall diet, nor exercise, but he endorses the desire to loose weight. Overall, he states that he's doing well and offers no further complaint    Review of Systems  Constitutional: Negative.   HENT: Negative.    Eyes: Negative.   Respiratory: Negative.    Cardiovascular: Negative.   Gastrointestinal: Negative.   Genitourinary: Negative.   Musculoskeletal: Negative.   Skin: Negative.   Neurological: Negative.   Endo/Heme/Allergies: Negative.   Psychiatric/Behavioral: Negative.        Objective:     BP 135/84 (BP Location: Right Arm, Patient Position: Sitting, Cuff Size: Large)   Pulse 81   Temp 97.7 F (36.5 C) (Oral)   Resp 16   Ht 5\' 10"  (1.778 m)   Wt (!) 366 lb 6.4 oz (166.2 kg)   SpO2 95%   BMI 52.57 kg/m  BP Readings from Last 3 Encounters:  04/05/22 135/84  03/29/22 138/80  10/27/21 123/78   Wt Readings from Last 3 Encounters:  04/05/22 (!) 366 lb 6.4 oz (166.2 kg)  03/29/22 (!) 361 lb 3.2 oz (163.8 kg)  10/27/21 (!) 367 lb 11.2 oz (166.8 kg)     Physical Exam Vitals reviewed.  Constitutional:      Appearance: Normal appearance.  HENT:     Head: Normocephalic.     Right Ear: External ear normal.     Left Ear:  External ear normal.     Nose: Nose normal.     Mouth/Throat:     Mouth: Mucous membranes are moist.  Eyes:     Extraocular Movements: Extraocular movements intact.     Pupils: Pupils are equal, round, and reactive to light.  Cardiovascular:     Rate and Rhythm: Normal rate and regular rhythm.     Pulses: Normal pulses.     Heart sounds: Normal heart sounds.  Pulmonary:     Effort: Pulmonary effort is normal.     Breath sounds: Normal breath sounds.  Abdominal:     General: Bowel sounds are normal.  Genitourinary:    Comments: Deferred patient request  Musculoskeletal:        General: Normal range of motion.     Cervical back: Normal range of motion.  Skin:    General: Skin is warm and dry.     Capillary Refill: Capillary refill takes less than 2 seconds.  Neurological:     Mental Status: He is alert and oriented to person, place, and time.  Psychiatric:        Mood and Affect: Mood normal.        Behavior: Behavior normal.        Thought Content: Thought content normal.  Judgment: Judgment normal.     Last CBC Lab Results  Component Value Date   WBC 5.6 03/29/2022   HGB 14.8 03/29/2022   HCT 42.5 03/29/2022   MCV 83 03/29/2022   MCH 28.7 03/29/2022   RDW 13.0 03/29/2022   PLT 276 87/56/4332   Last metabolic panel Lab Results  Component Value Date   GLUCOSE 95 03/29/2022   NA 140 03/29/2022   K 4.4 03/29/2022   CL 106 03/29/2022   CO2 20 03/29/2022   BUN 13 03/29/2022   CREATININE 0.92 03/29/2022   EGFR 107 03/29/2022   CALCIUM 9.5 03/29/2022   PROT 7.1 03/29/2022   ALBUMIN 4.4 03/29/2022   LABGLOB 2.7 03/29/2022   AGRATIO 1.6 03/29/2022   BILITOT 0.6 03/29/2022   ALKPHOS 110 03/29/2022   AST 30 03/29/2022   ALT 26 03/29/2022   ANIONGAP 6 08/18/2019   Last lipids Lab Results  Component Value Date   CHOL 191 03/29/2022   HDL 31 (L) 03/29/2022   LDLCALC 93 03/29/2022   TRIG 400 (H) 03/29/2022   CHOLHDL 6.2 (H) 03/29/2022   Last  hemoglobin A1c Lab Results  Component Value Date   HGBA1C 5.1 03/29/2022     The 10-year ASCVD risk score (Arnett DK, et al., 2019) is: 3.2%    Assessment & Plan:   1. Essential hypertension His blood pressure is under control, he will continue to take medications as prescribed. He is encouraged  to follow the DASH diet. - amLODipine (NORVASC) 5 MG tablet; Take 1 tablet (5 mg total) by mouth daily.  Dispense: 90 tablet; Refill: 1 - losartan (COZAAR) 50 MG tablet; TAKE ONE TABLET BY MOUTH ONCE EVERY DAY  Dispense: 90 tablet; Refill: 1 - metoprolol succinate (TOPROL-XL) 25 MG 24 hr tablet; TAKE 1 TABLET BY MOUTH ONCE DAILY.  Dispense: 90 tablet; Refill: 1  2. History of elevated lipids He will continue to take medications as prescribed. Encouraged to follow low fat/cholesterol diet and exercise as tolerated. He was educated  on portion control sizes. - atorvastatin (LIPITOR) 80 MG tablet; Take 1 tablet (80 mg total) by mouth daily.  Dispense: 90 tablet; Refill: 1  3. High triglycerides He was encouraged to follow low fat/cholesterol diet and exercise as tolerated. He was educated on portion control sizes.  4. History of gastroesophageal reflux (GERD) He denies GERD symptoms at this time. He will continue taking medication as prescribed. -Avoid spicy, fatty and fried food -Avoid sodas and sour juices -Avoid heavy meals -Avoid eating 4 hours before bedtime -Elevate head of bed at night - omeprazole (PRILOSEC) 20 MG capsule; Take 1 capsule (20 mg total) by mouth once daily.  Dispense: 90 capsule; Refill: 1  5. Morbid obesity (Anon Raices) He was provided with diet education on portion control, and was  encouraged to exercise as tolerated.  F/U on or around 07/05/22 or PRN if symptoms worsen  Lorre Munroe, RN

## 2022-04-05 NOTE — Patient Instructions (Addendum)
DASH Eating Plan DASH stands for Dietary Approaches to Stop Hypertension. The DASH eating plan is a healthy eating plan that has been shown to: Reduce high blood pressure (hypertension). Reduce your risk for type 2 diabetes, heart disease, and stroke. Help with weight loss. What are tips for following this plan? Reading food labels Check food labels for the amount of salt (sodium) per serving. Choose foods with less than 5 percent of the Daily Value of sodium. Generally, foods with less than 300 milligrams (mg) of sodium per serving fit into this eating plan. To find whole grains, look for the word "whole" as the first word in the ingredient list. Shopping Buy products labeled as "low-sodium" or "no salt added." Buy fresh foods. Avoid canned foods and pre-made or frozen meals. Cooking Avoid adding salt when cooking. Use salt-free seasonings or herbs instead of table salt or sea salt. Check with your health care provider or pharmacist before using salt substitutes. Do not fry foods. Cook foods using healthy methods such as baking, boiling, grilling, roasting, and broiling instead. Cook with heart-healthy oils, such as olive, canola, avocado, soybean, or sunflower oil. Meal planning  Eat a balanced diet that includes: 4 or more servings of fruits and 4 or more servings of vegetables each day. Try to fill one-half of your plate with fruits and vegetables. 6-8 servings of whole grains each day. Less than 6 oz (170 g) of lean meat, poultry, or fish each day. A 3-oz (85-g) serving of meat is about the same size as a deck of cards. One egg equals 1 oz (28 g). 2-3 servings of low-fat dairy each day. One serving is 1 cup (237 mL). 1 serving of nuts, seeds, or beans 5 times each week. 2-3 servings of heart-healthy fats. Healthy fats called omega-3 fatty acids are found in foods such as walnuts, flaxseeds, fortified milks, and eggs. These fats are also found in cold-water fish, such as sardines, salmon,  and mackerel. Limit how much you eat of: Canned or prepackaged foods. Food that is high in trans fat, such as some fried foods. Food that is high in saturated fat, such as fatty meat. Desserts and other sweets, sugary drinks, and other foods with added sugar. Full-fat dairy products. Do not salt foods before eating. Do not eat more than 4 egg yolks a week. Try to eat at least 2 vegetarian meals a week. Eat more home-cooked food and less restaurant, buffet, and fast food. Lifestyle When eating at a restaurant, ask that your food be prepared with less salt or no salt, if possible. If you drink alcohol: Limit how much you use to: 0-1 drink a day for women who are not pregnant. 0-2 drinks a day for men. Be aware of how much alcohol is in your drink. In the [place] one drink equals one 12 oz bottle of beer (355 mL), one 5 oz glass of wine (148 mL), or one 1 oz glass of hard liquor (44 mL). General information Avoid eating more than 2,300 mg of salt a day. If you have hypertension, you may need to reduce your sodium intake to 1,500 mg a day. Work with your health care provider to maintain a healthy body weight or to lose weight. Ask what an ideal weight is for you. Get at least 30 minutes of exercise that causes your heart to beat faster (aerobic exercise) most days of the week. Activities may include walking, swimming, or biking. Work with your health care provider or dietitian to  adjust your eating plan to your individual calorie needs. What foods should I eat? Fruits All fresh, dried, or frozen fruit. Canned fruit in natural juice (without added sugar). Vegetables Fresh or frozen vegetables (raw, steamed, roasted, or grilled). Low-sodium or reduced-sodium tomato and vegetable juice. Low-sodium or reduced-sodium tomato sauce and tomato paste. Low-sodium or reduced-sodium canned vegetables. Grains Whole-grain or whole-wheat bread. Whole-grain or whole-wheat pasta. Brown rice. Oatmeal. Quinoa.  Bulgur. Whole-grain and low-sodium cereals. Pita bread. Low-fat, low-sodium crackers. Whole-wheat flour tortillas. Meats and other proteins Skinless chicken or turkey. Ground chicken or turkey. Pork with fat trimmed off. Fish and seafood. Egg whites. Dried beans, peas, or lentils. Unsalted nuts, nut butters, and seeds. Unsalted canned beans. Lean cuts of beef with fat trimmed off. Low-sodium, lean precooked or cured meat, such as sausages or meat loaves. Dairy Low-fat (1%) or fat-free (skim) milk. Reduced-fat, low-fat, or fat-free cheeses. Nonfat, low-sodium ricotta or cottage cheese. Low-fat or nonfat yogurt. Low-fat, low-sodium cheese. Fats and oils Soft margarine without trans fats. Vegetable oil. Reduced-fat, low-fat, or light mayonnaise and salad dressings (reduced-sodium). Canola, safflower, olive, avocado, soybean, and sunflower oils. Avocado. Seasonings and condiments Herbs. Spices. Seasoning mixes without salt. Other foods Unsalted popcorn and pretzels. Fat-free sweets. The items listed above may not be a complete list of foods and beverages you can eat. Contact a dietitian for more information. What foods should I avoid? Fruits Canned fruit in a light or heavy syrup. Fried fruit. Fruit in cream or butter sauce. Vegetables Creamed or fried vegetables. Vegetables in a cheese sauce. Regular canned vegetables (not low-sodium or reduced-sodium). Regular canned tomato sauce and paste (not low-sodium or reduced-sodium). Regular tomato and vegetable juice (not low-sodium or reduced-sodium). Pickles. Olives. Grains Baked goods made with fat, such as croissants, muffins, or some breads. Dry pasta or rice meal packs. Meats and other proteins Fatty cuts of meat. Ribs. Fried meat. Bacon. Bologna, salami, and other precooked or cured meats, such as sausages or meat loaves. Fat from the back of a pig (fatback). Bratwurst. Salted nuts and seeds. Canned beans with added salt. Canned or smoked fish.  Whole eggs or egg yolks. Chicken or turkey with skin. Dairy Whole or 2% milk, cream, and half-and-half. Whole or full-fat cream cheese. Whole-fat or sweetened yogurt. Full-fat cheese. Nondairy creamers. Whipped toppings. Processed cheese and cheese spreads. Fats and oils Butter. Stick margarine. Lard. Shortening. Ghee. Bacon fat. Tropical oils, such as coconut, palm kernel, or palm oil. Seasonings and condiments Onion salt, garlic salt, seasoned salt, table salt, and sea salt. Worcestershire sauce. Tartar sauce. Barbecue sauce. Teriyaki sauce. Soy sauce, including reduced-sodium. Steak sauce. Canned and packaged gravies. Fish sauce. Oyster sauce. Cocktail sauce. Store-bought horseradish. Ketchup. Mustard. Meat flavorings and tenderizers. Bouillon cubes. Hot sauces. Pre-made or packaged marinades. Pre-made or packaged taco seasonings. Relishes. Regular salad dressings. Other foods Salted popcorn and pretzels. The items listed above may not be a complete list of foods and beverages you should avoid. Contact a dietitian for more information. Where to find more information National Heart, Lung, and Blood Institute: www.nhlbi.nih.gov American Heart Association: www.heart.org Academy of Nutrition and Dietetics: www.eatright.org National Kidney Foundation: www.kidney.org Summary The DASH eating plan is a healthy eating plan that has been shown to reduce high blood pressure (hypertension). It may also reduce your risk for type 2 diabetes, heart disease, and stroke. When on the DASH eating plan, aim to eat more fresh fruits and vegetables, whole grains, lean proteins, low-fat dairy, and heart-healthy fats. With the DASH   eating plan, you should limit salt (sodium) intake to 2,300 mg a day. If you have hypertension, you may need to reduce your sodium intake to 1,500 mg a day. Work with your health care provider or dietitian to adjust your eating plan to your individual calorie needs. This information is not  intended to replace advice given to you by your health care provider. Make sure you discuss any questions you have with your health care provider. Document Revised: 01/24/2019 Document Reviewed: 01/24/2019 Elsevier Patient Education  [place] [employer] Calories are units of energy. Your body needs a certain number of calories from food to keep going throughout the day. When you eat or drink more calories than your body needs, your body stores the extra calories mostly as fat. When you eat or drink fewer calories than your body needs, your body burns fat to get the energy it needs. Calorie counting means keeping track of how many calories you eat and drink each day. Calorie counting can be helpful if you need to lose weight. If you eat fewer calories than your body needs, you should lose weight. Ask your health care provider what a healthy weight is for you. For calorie counting to work, you will need to eat the right number of calories each day to lose a healthy amount of weight per week. A dietitian can help you figure out how many calories you need in a day and will suggest ways to reach your calorie goal. A healthy amount of weight to lose each week is usually 1-2 lb (0.5-0.9 kg). This usually means that your daily calorie intake should be reduced by 500-750 calories. Eating 1,200-1,500 calories a day can help most women lose weight. Eating 1,500-1,800 calories a day can help most men lose weight. What do I need to know about calorie counting? Work with your health care provider or dietitian to determine how many calories you should get each day. To meet your daily calorie goal, you will need to: Find out how many calories are in each food that you would like to eat. Try to do this before you eat. Decide how much of the food you plan to eat. Keep a food log. Do this by writing down what you ate and how many calories it had. To successfully lose weight, it is  important to balance calorie counting with a healthy lifestyle that includes regular activity. Where do I find calorie information?  The number of calories in a food can be found on a Nutrition Facts label. If a food does not have a Nutrition Facts label, try to look up the calories online or ask your dietitian for help. Remember that calories are listed per serving. If you choose to have more than one serving of a food, you will have to multiply the calories per serving by the number of servings you plan to eat. For example, the label on a package of bread might say that a serving size is 1 slice and that there are 90 calories in a serving. If you eat 1 slice, you will have eaten 90 calories. If you eat 2 slices, you will have eaten 180 calories. How do I keep a food log? After each time that you eat, record the following in your food log as soon as possible: What you ate. Be sure to include toppings, sauces, and other extras on the food. How much you ate. This can be measured in cups, ounces, or number  of items. How many calories were in each food and drink. The total number of calories in the food you ate. Keep your food log near you, such as in a pocket-sized notebook or on an app or website on your mobile phone. Some programs will calculate calories for you and show you how many calories you have left to meet your daily goal. What are some portion-control tips? Know how many calories are in a serving. This will help you know how many servings you can have of a certain food. Use a measuring cup to measure serving sizes. You could also try weighing out portions on a kitchen scale. With time, you will be able to estimate serving sizes for some foods. Take time to put servings of different foods on your favorite plates or in your favorite bowls and cups so you know what a serving looks like. Try not to eat straight from a food's packaging, such as from a bag or box. Eating straight from the package  makes it hard to see how much you are eating and can lead to overeating. Put the amount you would like to eat in a cup or on a plate to make sure you are eating the right portion. Use smaller plates, glasses, and bowls for smaller portions and to prevent overeating. Try not to multitask. For example, avoid watching TV or using your computer while eating. If it is time to eat, sit down at a table and enjoy your food. This will help you recognize when you are full. It will also help you be more mindful of what and how much you are eating. What are tips for following this plan? Reading food labels Check the calorie count compared with the serving size. The serving size may be smaller than what you are used to eating. Check the source of the calories. Try to choose foods that are high in protein, fiber, and vitamins, and low in saturated fat, trans fat, and sodium. Shopping Read nutrition labels while you shop. This will help you make healthy decisions about which foods to buy. Pay attention to nutrition labels for low-fat or fat-free foods. These foods sometimes have the same number of calories or more calories than the full-fat versions. They also often have added sugar, starch, or salt to make up for flavor that was removed with the fat. Make a grocery list of lower-calorie foods and stick to it. Cooking Try to cook your favorite foods in a healthier way. For example, try baking instead of frying. Use low-fat dairy products. Meal planning Use more fruits and vegetables. One-half of your plate should be fruits and vegetables. Include lean proteins, such as chicken, [place], and fish. Lifestyle Each week, aim to do one of the following: 150 minutes of moderate exercise, such as walking. 75 minutes of vigorous exercise, such as running. General information Know how many calories are in the foods you eat most often. This will help you calculate calorie counts faster. Find a way of tracking calories  that works for you. Get creative. Try different apps or programs if writing down calories does not work for you. What foods should I eat?  Eat nutritious foods. It is better to have a nutritious, high-calorie food, such as an avocado, than a food with few nutrients, such as a bag of potato chips. Use your calories on foods and drinks that will fill you up and will not leave you hungry soon after eating. Examples of foods that fill you up  are nuts and nut butters, vegetables, lean proteins, and high-fiber foods such as whole grains. High-fiber foods are foods with more than 5 g of fiber per serving. Pay attention to calories in drinks. Low-calorie drinks include water and unsweetened drinks. The items listed above may not be a complete list of foods and beverages you can eat. Contact a dietitian for more information. What foods should I limit? Limit foods or drinks that are not good sources of vitamins, minerals, or protein or that are high in unhealthy fats. These include: Candy. Other sweets. Sodas, specialty coffee drinks, alcohol, and juice. The items listed above may not be a complete list of foods and beverages you should avoid. Contact a dietitian for more information. How do I count calories when eating out? Pay attention to portions. Often, portions are much larger when eating out. Try these tips to keep portions smaller: Consider sharing a meal instead of getting your own. If you get your own meal, eat only half of it. Before you start eating, ask for a container and put half of your meal into it. When available, consider ordering smaller portions from the menu instead of full portions. Pay attention to your food and drink choices. Knowing the way food is cooked and what is included with the meal can help you eat fewer calories. If calories are listed on the menu, choose the lower-calorie options. Choose dishes that include vegetables, fruits, whole grains, low-fat dairy products, and  lean proteins. Choose items that are boiled, broiled, grilled, or steamed. Avoid items that are buttered, battered, fried, or served with cream sauce. Items labeled as crispy are usually fried, unless stated otherwise. Choose water, low-fat milk, unsweetened iced tea, or other drinks without added sugar. If you want an alcoholic beverage, choose a lower-calorie option, such as a glass of wine or light beer. Ask for dressings, sauces, and syrups on the side. These are usually high in calories, so you should limit the amount you eat. If you want a salad, choose a garden salad and ask for grilled meats. Avoid extra toppings such as bacon, cheese, or fried items. Ask for the dressing on the side, or ask for olive oil and vinegar or lemon to use as dressing. Estimate how many servings of a food you are given. Knowing serving sizes will help you be aware of how much food you are eating at restaurants. Where to find more information Centers for Disease Control and Prevention: http://www.wolf.info/ U.S. Department of Agriculture: http://www.wilson-mendoza.org/ Summary Calorie counting means keeping track of how many calories you eat and drink each day. If you eat fewer calories than your body needs, you should lose weight. A healthy amount of weight to lose per week is usually 1-2 lb (0.5-0.9 kg). This usually means reducing your daily calorie intake by 500-750 calories. The number of calories in a food can be found on a Nutrition Facts label. If a food does not have a Nutrition Facts label, try to look up the calories online or ask your dietitian for help. Use smaller plates, glasses, and bowls for smaller portions and to prevent overeating. Use your calories on foods and drinks that will fill you up and not leave you hungry shortly after a meal. This information is not intended to replace advice given to you by your health care provider. Make sure you discuss any questions you have with your health care provider. Document Revised:  04/03/2019 Document Reviewed: 04/03/2019 Elsevier Patient Education  [place]. Carbohydrate Counting  for Diabetes Mellitus, Adult Carbohydrate counting is a method of keeping track of how many carbohydrates you eat. Eating carbohydrates increases the amount of sugar (glucose) in the blood. Counting how many carbohydrates you eat improves how well you manage your blood glucose. This, in turn, helps you manage your diabetes. Carbohydrates are measured in grams (g) per serving. It is important to know how many carbohydrates (in grams or by serving size) you can have in each meal. This is different for every person. A dietitian can help you make a meal plan and calculate how many carbohydrates you should have at each meal and snack. What foods contain carbohydrates? Carbohydrates are found in the following foods: Grains, such as breads and cereals. Dried beans and soy products. Starchy vegetables, such as potatoes, peas, and corn. Fruit and fruit juices. Milk and yogurt. Sweets and snack foods, such as cake, cookies, candy, chips, and soft drinks. How do I count carbohydrates in foods? There are two ways to count carbohydrates in food. You can read food labels or learn standard serving sizes of foods. You can use either of these methods or a combination of both. Using the Nutrition Facts label The Nutrition Facts list is included on the labels of almost all packaged foods and beverages in the [place]. It includes: The serving size. Information about nutrients in each serving, including the grams of carbohydrate per serving. To use the Nutrition Facts, decide how many servings you will have. Then, multiply the number of servings by the number of carbohydrates per serving. The resulting number is the total grams of carbohydrates that you will be having. Learning the standard serving sizes of foods When you eat carbohydrate foods that are not packaged or do not include Nutrition Facts  on the label, you need to measure the servings in order to count the grams of carbohydrates. Measure the foods that you will eat with a food scale or measuring cup, if needed. Decide how many standard-size servings you will eat. Multiply the number of servings by 15. For foods that contain carbohydrates, one serving equals 15 g of carbohydrates. For example, if you eat 2 cups or 10 oz (300 g) of strawberries, you will have eaten 2 servings and 30 g of carbohydrates (2 servings x 15 g = 30 g). For foods that have more than one food mixed, such as soups and casseroles, you must count the carbohydrates in each food that is included. The following list contains standard serving sizes of common carbohydrate-rich foods. Each of these servings has about 15 g of carbohydrates: 1 slice of bread. 1 six-inch (15 cm) tortilla. ? cup or 2 oz (53 g) cooked rice or pasta.  cup or 3 oz (85 g) cooked or canned, drained and rinsed beans or lentils.  cup or 3 oz (85 g) starchy vegetable, such as peas, corn, or squash.  cup or 4 oz (120 g) hot cereal.  cup or 3 oz (85 g) boiled or mashed potatoes, or  or 3 oz (85 g) of a large baked potato.  cup or 4 fl oz (118 mL) fruit juice. 1 cup or 8 fl oz (237 mL) milk. 1 small or 4 oz (106 g) apple.  or 2 oz (63 g) of a medium banana. 1 cup or 5 oz (150 g) strawberries. 3 cups or 1 oz (28.3 g) popped popcorn. What is an example of carbohydrate counting? To calculate the grams of carbohydrates in this sample meal, follow the steps shown below.  Sample meal 3 oz (85 g) chicken breast. ? cup or 4 oz (106 g) brown rice.  cup or 3 oz (85 g) corn. 1 cup or 8 fl oz (237 mL) milk. 1 cup or 5 oz (150 g) strawberries with sugar-free whipped topping. Carbohydrate calculation Identify the foods that contain carbohydrates: Rice. Corn. Milk. Strawberries. Calculate how many servings you have of each food: 2 servings rice. 1 serving corn. 1 serving milk. 1 serving  strawberries. Multiply each number of servings by 15 g: 2 servings rice x 15 g = 30 g. 1 serving corn x 15 g = 15 g. 1 serving milk x 15 g = 15 g. 1 serving strawberries x 15 g = 15 g. Add together all of the amounts to find the total grams of carbohydrates eaten: 30 g + 15 g + 15 g + 15 g = 75 g of carbohydrates total. What are tips for following this plan? Shopping Develop a meal plan and then make a shopping list. Buy fresh and frozen vegetables, fresh and frozen fruit, dairy, eggs, beans, lentils, and whole grains. Look at food labels. Choose foods that have more fiber and less sugar. Avoid processed foods and foods with added sugars. Meal planning Aim to have the same number of grams of carbohydrates at each meal and for each snack time. Plan to have regular, balanced meals and snacks. Where to find more information American Diabetes Association: diabetes.org Centers for Disease Control and Prevention: TonerPromos.no Academy of Nutrition and Dietetics: eatright.org Association of Diabetes Care & Education Specialists: diabeteseducator.org Summary Carbohydrate counting is a method of keeping track of how many carbohydrates you eat. Eating carbohydrates increases the amount of sugar (glucose) in your blood. Counting how many carbohydrates you eat improves how well you manage your blood glucose. This helps you manage your diabetes. A dietitian can help you make a meal plan and calculate how many carbohydrates you should have at each meal and snack. This information is not intended to replace advice given to you by your health care provider. Make sure you discuss any questions you have with your health care provider. Document Revised: 09/24/2019 Document Reviewed: 09/24/2019 Elsevier Patient Education  2023 [employer].

## 2022-04-08 LAB — MICROALBUMIN / CREATININE URINE RATIO
Creatinine, Urine: 135.2 mg/dL
Microalb/Creat Ratio: 4 mg/g creat (ref 0–29)
Microalbumin, Urine: 5.7 ug/mL

## 2022-04-13 ENCOUNTER — Other Ambulatory Visit: Payer: Self-pay

## 2022-04-15 ENCOUNTER — Other Ambulatory Visit: Payer: Self-pay

## 2022-04-18 ENCOUNTER — Other Ambulatory Visit: Payer: Self-pay

## 2022-04-27 ENCOUNTER — Other Ambulatory Visit: Payer: Self-pay

## 2022-05-25 ENCOUNTER — Other Ambulatory Visit: Payer: Self-pay

## 2022-07-04 ENCOUNTER — Ambulatory Visit: Payer: Self-pay | Admitting: Gerontology

## 2022-07-23 ENCOUNTER — Other Ambulatory Visit: Payer: Self-pay

## 2022-07-24 ENCOUNTER — Other Ambulatory Visit: Payer: Self-pay

## 2022-08-21 ENCOUNTER — Other Ambulatory Visit: Payer: Self-pay

## 2022-09-13 ENCOUNTER — Ambulatory Visit: Payer: Self-pay | Admitting: Gerontology

## 2022-09-28 ENCOUNTER — Other Ambulatory Visit: Payer: Self-pay

## 2022-09-28 ENCOUNTER — Ambulatory Visit: Payer: Self-pay | Admitting: Gerontology

## 2022-09-28 ENCOUNTER — Encounter: Payer: Self-pay | Admitting: Gerontology

## 2022-09-28 VITALS — BP 125/78 | HR 78 | Wt 353.0 lb

## 2022-09-28 DIAGNOSIS — I10 Essential (primary) hypertension: Secondary | ICD-10-CM

## 2022-09-28 DIAGNOSIS — Z8719 Personal history of other diseases of the digestive system: Secondary | ICD-10-CM

## 2022-09-28 DIAGNOSIS — Z0000 Encounter for general adult medical examination without abnormal findings: Secondary | ICD-10-CM

## 2022-09-28 DIAGNOSIS — Z8639 Personal history of other endocrine, nutritional and metabolic disease: Secondary | ICD-10-CM

## 2022-09-28 MED ORDER — AMLODIPINE BESYLATE 5 MG PO TABS
5.0000 mg | ORAL_TABLET | Freq: Every day | ORAL | 1 refills | Status: DC
  Filled 2022-09-28 – 2022-10-20 (×2): qty 90, 90d supply, fill #0
  Filled 2023-01-20: qty 90, 90d supply, fill #1

## 2022-09-28 MED ORDER — OMEPRAZOLE 20 MG PO CPDR
20.0000 mg | DELAYED_RELEASE_CAPSULE | Freq: Every day | ORAL | 1 refills | Status: DC
  Filled 2022-09-28 – 2022-11-17 (×2): qty 90, 90d supply, fill #0
  Filled 2023-02-11: qty 90, 90d supply, fill #1

## 2022-09-28 MED ORDER — LOSARTAN POTASSIUM 50 MG PO TABS
50.0000 mg | ORAL_TABLET | Freq: Every day | ORAL | 1 refills | Status: DC
  Filled 2022-09-28 – 2022-11-17 (×2): qty 90, 90d supply, fill #0
  Filled 2023-02-11: qty 90, 90d supply, fill #1

## 2022-09-28 MED ORDER — ATORVASTATIN CALCIUM 80 MG PO TABS
80.0000 mg | ORAL_TABLET | Freq: Every day | ORAL | 1 refills | Status: DC
  Filled 2022-09-28 – 2022-10-20 (×2): qty 90, 90d supply, fill #0
  Filled 2023-01-20: qty 90, 90d supply, fill #1

## 2022-09-28 MED ORDER — METOPROLOL SUCCINATE ER 25 MG PO TB24
25.0000 mg | ORAL_TABLET | Freq: Every day | ORAL | 1 refills | Status: DC
  Filled 2022-09-28 – 2022-10-20 (×2): qty 90, 90d supply, fill #0
  Filled 2023-01-20: qty 90, 90d supply, fill #1

## 2022-09-28 NOTE — Patient Instructions (Addendum)
Calorie Counting for Weight Loss Calories are units of energy. Your body needs a certain number of calories from food to keep going throughout the day. When you eat or drink more calories than your body needs, your body stores the extra calories mostly as fat. When you eat or drink fewer calories than your body needs, your body burns fat to get the energy it needs. Calorie counting means keeping track of how many calories you eat and drink each day. Calorie counting can be helpful if you need to lose weight. If you eat fewer calories than your body needs, you should lose weight. Ask your health care provider what a healthy weight is for you. For calorie counting to work, you will need to eat the right number of calories each day to lose a healthy amount of weight per week. A dietitian can help you figure out how many calories you need in a day and will suggest ways to reach your calorie goal. A healthy amount of weight to lose each week is usually 1-2 lb (0.5-0.9 kg). This usually means that your daily calorie intake should be reduced by 500-750 calories. Eating 1,200-1,500 calories a day can help most women lose weight. Eating 1,500-1,800 calories a day can help most men lose weight. What do I need to know about calorie counting? Work with your health care provider or dietitian to determine how many calories you should get each day. To meet your daily calorie goal, you will need to: Find out how many calories are in each food that you would like to eat. Try to do this before you eat. Decide how much of the food you plan to eat. Keep a food log. Do this by writing down what you ate and how many calories it had. To successfully lose weight, it is important to balance calorie counting with a healthy lifestyle that includes regular activity. Where do I find calorie information?  The number of calories in a food can be found on a Nutrition Facts label. If a food does not have a Nutrition Facts label, try  to look up the calories online or ask your dietitian for help. Remember that calories are listed per serving. If you choose to have more than one serving of a food, you will have to multiply the calories per serving by the number of servings you plan to eat. For example, the label on a package of bread might say that a serving size is 1 slice and that there are 90 calories in a serving. If you eat 1 slice, you will have eaten 90 calories. If you eat 2 slices, you will have eaten 180 calories. How do I keep a food log? After each time that you eat, record the following in your food log as soon as possible: What you ate. Be sure to include toppings, sauces, and other extras on the food. How much you ate. This can be measured in cups, ounces, or number of items. How many calories were in each food and drink. The total number of calories in the food you ate. Keep your food log near you, such as in a pocket-sized notebook or on an app or website on your mobile phone. Some programs will calculate calories for you and show you how many calories you have left to meet your daily goal. What are some portion-control tips? Know how many calories are in a serving. This will help you know how many servings you can have of a certain   food. Use a measuring cup to measure serving sizes. You could also try weighing out portions on a kitchen scale. With time, you will be able to estimate serving sizes for some foods. Take time to put servings of different foods on your favorite plates or in your favorite bowls and cups so you know what a serving looks like. Try not to eat straight from a food's packaging, such as from a bag or box. Eating straight from the package makes it hard to see how much you are eating and can lead to overeating. Put the amount you would like to eat in a cup or on a plate to make sure you are eating the right portion. Use smaller plates, glasses, and bowls for smaller portions and to prevent  overeating. Try not to multitask. For example, avoid watching TV or using your computer while eating. If it is time to eat, sit down at a table and enjoy your food. This will help you recognize when you are full. It will also help you be more mindful of what and how much you are eating. What are tips for following this plan? Reading food labels Check the calorie count compared with the serving size. The serving size may be smaller than what you are used to eating. Check the source of the calories. Try to choose foods that are high in protein, fiber, and vitamins, and low in saturated fat, trans fat, and sodium. Shopping Read nutrition labels while you shop. This will help you make healthy decisions about which foods to buy. Pay attention to nutrition labels for low-fat or fat-free foods. These foods sometimes have the same number of calories or more calories than the full-fat versions. They also often have added sugar, starch, or salt to make up for flavor that was removed with the fat. Make a grocery list of lower-calorie foods and stick to it. Cooking Try to cook your favorite foods in a healthier way. For example, try baking instead of frying. Use low-fat dairy products. Meal planning Use more fruits and vegetables. One-half of your plate should be fruits and vegetables. Include lean proteins, such as chicken, turkey, and fish. Lifestyle Each week, aim to do one of the following: 150 minutes of moderate exercise, such as walking. 75 minutes of vigorous exercise, such as running. General information Know how many calories are in the foods you eat most often. This will help you calculate calorie counts faster. Find a way of tracking calories that works for you. Get creative. Try different apps or programs if writing down calories does not work for you. What foods should I eat?  Eat nutritious foods. It is better to have a nutritious, high-calorie food, such as an avocado, than a food with  few nutrients, such as a bag of potato chips. Use your calories on foods and drinks that will fill you up and will not leave you hungry soon after eating. Examples of foods that fill you up are nuts and nut butters, vegetables, lean proteins, and high-fiber foods such as whole grains. High-fiber foods are foods with more than 5 g of fiber per serving. Pay attention to calories in drinks. Low-calorie drinks include water and unsweetened drinks. The items listed above may not be a complete list of foods and beverages you can eat. Contact a dietitian for more information. What foods should I limit? Limit foods or drinks that are not good sources of vitamins, minerals, or protein or that are high in unhealthy fats. These   include: Candy. Other sweets. Sodas, specialty coffee drinks, alcohol, and juice. The items listed above may not be a complete list of foods and beverages you should avoid. Contact a dietitian for more information. How do I count calories when eating out? Pay attention to portions. Often, portions are much larger when eating out. Try these tips to keep portions smaller: Consider sharing a meal instead of getting your own. If you get your own meal, eat only half of it. Before you start eating, ask for a container and put half of your meal into it. When available, consider ordering smaller portions from the menu instead of full portions. Pay attention to your food and drink choices. Knowing the way food is cooked and what is included with the meal can help you eat fewer calories. If calories are listed on the menu, choose the lower-calorie options. Choose dishes that include vegetables, fruits, whole grains, low-fat dairy products, and lean proteins. Choose items that are boiled, broiled, grilled, or steamed. Avoid items that are buttered, battered, fried, or served with cream sauce. Items labeled as crispy are usually fried, unless stated otherwise. Choose water, low-fat milk,  unsweetened iced tea, or other drinks without added sugar. If you want an alcoholic beverage, choose a lower-calorie option, such as a glass of wine or light beer. Ask for dressings, sauces, and syrups on the side. These are usually high in calories, so you should limit the amount you eat. If you want a salad, choose a garden salad and ask for grilled meats. Avoid extra toppings such as bacon, cheese, or fried items. Ask for the dressing on the side, or ask for olive oil and vinegar or lemon to use as dressing. Estimate how many servings of a food you are given. Knowing serving sizes will help you be aware of how much food you are eating at restaurants. Where to find more information Centers for Disease Control and Prevention: FootballExhibition.com.br U.[place]. Department of Agriculture: WrestlingReporter.dk Summary Calorie counting means keeping track of how many calories you eat and drink each day. If you eat fewer calories than your body needs, you should lose weight. A healthy amount of weight to lose per week is usually 1-2 lb (0.5-0.9 kg). This usually means reducing your daily calorie intake by 500-750 calories. The number of calories in a food can be found on a Nutrition Facts label. If a food does not have a Nutrition Facts label, try to look up the calories online or ask your dietitian for help. Use smaller plates, glasses, and bowls for smaller portions and to prevent overeating. Use your calories on foods and drinks that will fill you up and not leave you hungry shortly after a meal. This information is not intended to replace advice given to you by your health care provider. Make sure you discuss any questions you have with your health care provider. Document Revised: 04/03/2019 Document Reviewed: 04/03/2019 Elsevier Patient Education  2023 [employer] DASH Eating Plan DASH stands for Dietary Approaches to Stop Hypertension. The DASH eating plan is a healthy eating plan that has been shown to: Lower high  blood pressure (hypertension). Reduce your risk for type 2 diabetes, heart disease, and stroke. Help with weight loss. What are tips for following this plan? Reading food labels Check food labels for the amount of salt (sodium) per serving. Choose foods with less than 5 percent of the Daily Value (DV) of sodium. In general, foods with less than 300 milligrams (mg) of sodium per serving  fit into this eating plan. To find whole grains, look for the word "whole" as the first word in the ingredient list. Shopping Buy products labeled as "low-sodium" or "no salt added." Buy fresh foods. Avoid canned foods and pre-made or frozen meals. Cooking Try not to add salt when you cook. Use salt-free seasonings or herbs instead of table salt or sea salt. Check with your health care provider or pharmacist before using salt substitutes. Do not fry foods. Cook foods in healthy ways, such as baking, boiling, grilling, roasting, or broiling. Cook using oils that are good for your heart. These include olive, canola, avocado, soybean, and sunflower oil. Meal planning  Eat a balanced diet. This should include: 4 or more servings of fruits and 4 or more servings of vegetables each day. Try to fill half of your plate with fruits and vegetables. 6-8 servings of whole grains each day. 6 or less servings of lean meat, poultry, or fish each day. 1 oz is 1 serving. A 3 oz (85 g) serving of meat is about the same size as the palm of your hand. One egg is 1 oz (28 g). 2-3 servings of low-fat dairy each day. One serving is 1 cup (237 mL). 1 serving of nuts, seeds, or beans 5 times each week. 2-3 servings of heart-healthy fats. Healthy fats called omega-3 fatty acids are found in foods such as walnuts, flaxseeds, fortified milks, and eggs. These fats are also found in cold-water fish, such as sardines, salmon, and mackerel. Limit how much you eat of: Canned or prepackaged foods. Food that is high in trans fat, such as fried  foods. Food that is high in saturated fat, such as fatty meat. Desserts and other sweets, sugary drinks, and other foods with added sugar. Full-fat dairy products. Do not salt foods before eating. Do not eat more than 4 egg yolks a week. Try to eat at least 2 vegetarian meals a week. Eat more home-cooked food and less restaurant, buffet, and fast food. Lifestyle When eating at a restaurant, ask if your food can be made with less salt or no salt. If you drink alcohol: Limit how much you have to: 0-1 drink a day if you are male. 0-2 drinks a day if you are male. Know how much alcohol is in your drink. In the [place] one drink is one 12 oz bottle of beer (355 mL), one 5 oz glass of wine (148 mL), or one 1 oz glass of hard liquor (44 mL). General information Avoid eating more than 2,300 mg of salt a day. If you have hypertension, you may need to reduce your sodium intake to 1,500 mg a day. Work with your provider to stay at a healthy body weight or lose weight. Ask what the best weight range is for you. On most days of the week, get at least 30 minutes of exercise that causes your heart to beat faster. This may include walking, swimming, or biking. Work with your provider or dietitian to adjust your eating plan to meet your specific calorie needs. What foods should I eat? Fruits All fresh, dried, or frozen fruit. Canned fruits that are in their natural juice and do not have sugar added to them. Vegetables Fresh or frozen vegetables that are raw, steamed, roasted, or grilled. Low-sodium or reduced-sodium tomato and vegetable juice. Low-sodium or reduced-sodium tomato sauce and tomato paste. Low-sodium or reduced-sodium canned vegetables. Grains Whole-grain or whole-wheat bread. Whole-grain or whole-wheat pasta. Brown rice. Orpah Cobb. Bulgur. Whole-grain  and low-sodium cereals. Pita bread. Low-fat, low-sodium crackers. Whole-wheat flour tortillas. Meats and other proteins Skinless  chicken or [place]. Ground chicken or [place]. Pork with fat trimmed off. Fish and seafood. Egg whites. Dried beans, peas, or lentils. Unsalted nuts, nut butters, and seeds. Unsalted canned beans. Lean cuts of beef with fat trimmed off. Low-sodium, lean precooked or cured meat, such as sausages or meat loaves. Dairy Low-fat (1%) or fat-free (skim) milk. Reduced-fat, low-fat, or fat-free cheeses. Nonfat, low-sodium ricotta or cottage cheese. Low-fat or nonfat yogurt. Low-fat, low-sodium cheese. Fats and oils Soft margarine without trans fats. Vegetable oil. Reduced-fat, low-fat, or light mayonnaise and salad dressings (reduced-sodium). Canola, safflower, olive, avocado, soybean, and sunflower oils. Avocado. Seasonings and condiments Herbs. Spices. Seasoning mixes without salt. Other foods Unsalted popcorn and pretzels. Fat-free sweets. The items listed above may not be all the foods and drinks you can have. Talk to a dietitian to learn more. What foods should I avoid? Fruits Canned fruit in a light or heavy syrup. Fried fruit. Fruit in cream or butter sauce. Vegetables Creamed or fried vegetables. Vegetables in a cheese sauce. Regular canned vegetables that are not marked as low-sodium or reduced-sodium. Regular canned tomato sauce and paste that are not marked as low-sodium or reduced-sodium. Regular tomato and vegetable juices that are not marked as low-sodium or reduced-sodium. Rosita Fire. Olives. Grains Baked goods made with fat, such as croissants, muffins, or some breads. Dry pasta or rice meal packs. Meats and other proteins Fatty cuts of meat. Ribs. Fried meat. Tomasa Blase. Bologna, salami, and other precooked or cured meats, such as sausages or meat loaves, that are not lean and low in sodium. Fat from the back of a pig (fatback). Bratwurst. Salted nuts and seeds. Canned beans with added salt. Canned or smoked fish. Whole eggs or egg yolks. Chicken or [place] with skin. Dairy Whole or 2% milk, cream,  and half-and-half. Whole or full-fat cream cheese. Whole-fat or sweetened yogurt. Full-fat cheese. Nondairy creamers. Whipped toppings. Processed cheese and cheese spreads. Fats and oils Butter. Stick margarine. Lard. Shortening. Ghee. Bacon fat. Tropical oils, such as coconut, palm kernel, or palm oil. Seasonings and condiments Onion salt, garlic salt, seasoned salt, table salt, and sea salt. Worcestershire sauce. Tartar sauce. Barbecue sauce. Teriyaki sauce. Soy sauce, including reduced-sodium soy sauce. Steak sauce. Canned and packaged gravies. Fish sauce. Oyster sauce. Cocktail sauce. Store-bought horseradish. Ketchup. Mustard. Meat flavorings and tenderizers. Bouillon cubes. Hot sauces. Pre-made or packaged marinades. Pre-made or packaged taco seasonings. Relishes. Regular salad dressings. Other foods Salted popcorn and pretzels. The items listed above may not be all the foods and drinks you should avoid. Talk to a dietitian to learn more. Where to find more information National Heart, Lung, and Blood Institute (NHLBI): BuffaloDryCleaner.gl American Heart Association (AHA): heart.org Academy of Nutrition and Dietetics: eatright.org National Kidney Foundation (NKF): kidney.org This information is not intended to replace advice given to you by your health care provider. Make sure you discuss any questions you have with your health care provider. Document Revised: 03/09/2022 Document Reviewed: 03/09/2022 Elsevier Patient Education  2024 [employer].

## 2022-09-28 NOTE — Progress Notes (Signed)
Established Patient Office Visit  Subjective   Patient ID: Logan Ball, male    DOB: May 09, 1980  Age: 42 y.o. MRN: [id number]  No chief complaint on file.   HPI  Logan D Valvano is a 42y/o male who has history of hypertension, hyperlipidemia, morbid obesity, acid reflux, presents for routine follow up.  He states that he's compliant with his medications, and denies side effects. Blood pressure at today's visit: 125/78 mmHg, HR 78 beats/minute. He didn't check Bp at home due to no Bp machine. He denies headache, chest pain, palpitation, shortness of breath. He reports that he doesn't follow an overall diet, nor exercise, but he endorses the desire to loose weight. Overall, he states that he's doing well and offers no further complaint.   Review of Systems  Constitutional: Negative.   HENT: Negative.    Eyes: Negative.   Respiratory: Negative.    Cardiovascular: Negative.   Gastrointestinal: Negative.   Genitourinary: Negative.   Musculoskeletal: Negative.   Skin: Negative.   Neurological: Negative.   Endo/Heme/Allergies: Negative.   Psychiatric/Behavioral: Negative.       Objective:     There were no vitals taken for this visit.    Physical Exam Constitutional:      Appearance: Normal appearance.  HENT:     Head: Normocephalic.     Right Ear: Tympanic membrane normal.     Left Ear: Tympanic membrane normal.     Nose: Nose normal.     Mouth/Throat:     Mouth: Mucous membranes are moist.  Eyes:     Extraocular Movements: Extraocular movements intact.     Pupils: Pupils are equal, round, and reactive to light.  Cardiovascular:     Rate and Rhythm: Normal rate and regular rhythm.     Pulses: Normal pulses.     Heart sounds: Normal heart sounds.  Pulmonary:     Effort: Pulmonary effort is normal.     Breath sounds: Normal breath sounds.  Abdominal:     General: Bowel sounds are normal.     Palpations: Abdomen is soft.  Genitourinary:    Comments: Patient  deferred Musculoskeletal:        General: Normal range of motion.     Cervical back: Normal range of motion and neck supple.  Skin:    General: Skin is warm.     Capillary Refill: Capillary refill takes less than 2 seconds.  Neurological:     General: No focal deficit present.     Mental Status: He is alert and oriented to person, place, and time.  Psychiatric:        Mood and Affect: Mood normal.    No results found for any visits on 09/28/22.  Last CBC Lab Results  Component Value Date   WBC 5.6 03/29/2022   HGB 14.8 03/29/2022   HCT 42.5 03/29/2022   MCV 83 03/29/2022   MCH 28.7 03/29/2022   RDW 13.0 03/29/2022   PLT 276 03/29/2022      The 10-year ASCVD risk score (Arnett DK, et al., 2019) is: 3.6%    Assessment & Plan:  1. Essential hypertension -His blood pressure is under control, he will continue current medications, DASH diet and exercise as tolerated. Provided his Bp machine. He will check Bp daily at home and bring log at the next visit. - amLODipine (NORVASC) 5 MG tablet; Take 1 tablet (5 mg total) by mouth daily.  Dispense: 90 tablet; Refill: 1 - losartan (COZAAR) 50 MG  tablet; Take 1 tablet (50 mg total) by mouth daily.  Dispense: 90 tablet; Refill: 1 - metoprolol succinate (TOPROL-XL) 25 MG 24 hr tablet; Take 1 tablet (25 mg total) by mouth daily.  Dispense: 90 tablet; Refill: 1  2. History of elevated lipids His 10 years ASCVD risk score is 3.6%. He was educated low fat/cholesterol diet and exercise as tolerated. He will continue current medication. - atorvastatin (LIPITOR) 80 MG tablet; Take 1 tablet (80 mg total) by mouth daily.  Dispense: 90 tablet; Refill: 1 - Lipid panel; Future  3. History of gastroesophageal reflux (GERD) He denies GERD symptoms at this time. He will continue taking medication as prescribed. -Avoid spicy, fatty and fried food -Avoid sodas and sour juices -Avoid heavy meals -Avoid eating 4 hours before bedtime -Elevate head of  bed at night - omeprazole (PRILOSEC) 20 MG capsule; Take 1 capsule (20 mg total) by mouth once daily.  Dispense: 90 capsule; Refill: 1  4. Healthcare maintenance -Routine labs will be checked - HgB A1c; Future - TSH; Future - Comp Met (CMET); Future  5. Morbid obesity (HCC) He was encouraged to loose weight and continue on calorie count, and exercise as tolerated.    Lab work on 10/04/2022. Follow up 10/12/2022 in clinic.     Odette Fraction, NP

## 2022-10-04 ENCOUNTER — Other Ambulatory Visit: Payer: Self-pay

## 2022-10-04 DIAGNOSIS — Z0000 Encounter for general adult medical examination without abnormal findings: Secondary | ICD-10-CM

## 2022-10-04 DIAGNOSIS — Z8639 Personal history of other endocrine, nutritional and metabolic disease: Secondary | ICD-10-CM

## 2022-10-05 LAB — LIPID PANEL

## 2022-10-05 LAB — SPECIMEN STATUS REPORT

## 2022-10-12 ENCOUNTER — Ambulatory Visit: Payer: Self-pay | Admitting: Gerontology

## 2022-10-12 ENCOUNTER — Ambulatory Visit: Payer: Self-pay

## 2022-10-20 ENCOUNTER — Other Ambulatory Visit: Payer: Self-pay

## 2022-10-26 ENCOUNTER — Ambulatory Visit: Payer: Self-pay

## 2022-11-17 ENCOUNTER — Other Ambulatory Visit: Payer: Self-pay

## 2022-11-23 ENCOUNTER — Encounter: Payer: Self-pay | Admitting: Gerontology

## 2022-11-23 ENCOUNTER — Ambulatory Visit: Payer: Self-pay | Admitting: Gerontology

## 2022-11-23 VITALS — BP 119/78 | HR 82 | Ht 70.0 in | Wt 347.0 lb

## 2022-11-23 DIAGNOSIS — I10 Essential (primary) hypertension: Secondary | ICD-10-CM

## 2022-11-23 DIAGNOSIS — Z8639 Personal history of other endocrine, nutritional and metabolic disease: Secondary | ICD-10-CM

## 2022-11-23 NOTE — Progress Notes (Signed)
Established Patient Office Visit  Subjective   Patient ID: Logan D Gingrich, male    DOB: 1980-10-29  Age: 42 y.o. MRN: [id number]  No chief complaint on file.   HPI  Logan Ball is a 42y/o male who has history of hypertension, hyperlipidemia, morbid obesity, acid reflux, presents for follow up and lab review. He arrived to the clinic with a blood pressure log with readings systolic 122-220 and diastolic 70- 97. He denies chest pain, SOB, headaches, or changes to his vision. He states that he walks at work, following a DASH diet, and drinking 4-5 bottles of water daily. He states that he is compliant with prescribed medications, denies side effects and continues to make healthy lifestyle changes. Overall, he is doing well and offers no further complaints at this time.   Review of Systems  Constitutional: Negative.   Eyes: Negative.   Respiratory: Negative.    Cardiovascular: Negative.   Gastrointestinal: Negative.   Skin: Negative.   Neurological: Negative.   Endo/Heme/Allergies: Negative.   Psychiatric/Behavioral: Negative.        Objective:     There were no vitals taken for this visit. BP Readings from Last 3 Encounters:  09/28/22 125/78  04/05/22 135/84  03/29/22 138/80   Wt Readings from Last 3 Encounters:  09/28/22 (!) 353 lb (160.1 kg)  04/05/22 (!) 366 lb 6.4 oz (166.2 kg)  03/29/22 (!) 361 lb 3.2 oz (163.8 kg)      Physical Exam HENT:     Head: Normocephalic and atraumatic.     Mouth/Throat:     Mouth: Mucous membranes are moist.     Pharynx: Oropharynx is clear.  Eyes:     Pupils: Pupils are equal, round, and reactive to light.  Cardiovascular:     Rate and Rhythm: Normal rate and regular rhythm.     Pulses: Normal pulses.     Heart sounds: Normal heart sounds.  Pulmonary:     Effort: Pulmonary effort is normal.     Breath sounds: Normal breath sounds.  Skin:    General: Skin is warm.     Capillary Refill: Capillary refill takes less than 2  seconds.  Neurological:     General: No focal deficit present.     Mental Status: He is alert and oriented to person, place, and time. Mental status is at baseline.  Psychiatric:        Mood and Affect: Mood normal.        Behavior: Behavior normal.        Thought Content: Thought content normal.        Judgment: Judgment normal.      No results found for any visits on 11/23/22.  Last CBC Lab Results  Component Value Date   WBC 5.6 03/29/2022   HGB 14.8 03/29/2022   HCT 42.5 03/29/2022   MCV 83 03/29/2022   MCH 28.7 03/29/2022   RDW 13.0 03/29/2022   PLT 276 03/29/2022   Last metabolic panel Lab Results  Component Value Date   GLUCOSE 110 (H) 10/04/2022   NA 142 10/04/2022   K 4.3 10/04/2022   CL 108 (H) 10/04/2022   CO2 19 (L) 10/04/2022   BUN 14 10/04/2022   CREATININE 0.91 10/04/2022   EGFR 107 10/04/2022   CALCIUM 9.6 10/04/2022   PROT 7.1 10/04/2022   ALBUMIN 4.5 10/04/2022   LABGLOB 2.6 10/04/2022   AGRATIO 1.6 03/29/2022   BILITOT 0.5 10/04/2022   ALKPHOS 112 10/04/2022  AST 25 10/04/2022   ALT 23 10/04/2022   ANIONGAP 6 08/18/2019   Last lipids Lab Results  Component Value Date   CHOL CANCELED 10/04/2022   CHOL 161 10/04/2022   HDL CANCELED 10/04/2022   HDL 25 (L) 10/04/2022   LDLCALC 83 10/04/2022   TRIG CANCELED 10/04/2022   TRIG 323 (H) 10/04/2022   CHOLHDL 6.4 (H) 10/04/2022   Last hemoglobin A1c Lab Results  Component Value Date   HGBA1C 4.9 10/04/2022   Last thyroid functions Lab Results  Component Value Date   TSH 1.090 10/04/2022   Last vitamin D No results found for: "25OHVITD2", "25OHVITD3", "VD25OH" Last vitamin B12 and Folate No results found for: "VITAMINB12", "FOLATE"    The 10-year ASCVD risk score (Arnett DK, et al., 2019) is: 3%    Assessment & Plan:  1. Essential hypertension -His blood pressure is under control, he will continue on current medications, follow a DASH diet, and exercise as tolerated. Allowed pt  to demonstrate how he uses his BP cuff at home. Reading from wrist BP cuff did not align with upper extremity cuff reading. Encouraged him to get another BP cuff for upper extremity.   2. Morbid obesity (HCC) - He weight has decreased from 353 lb to 347 lb. He was encouraged to continue to loose weight, exercise as tolerated and follow a healthy diet.   3. History of elevated lipids - His ASCVD risk score is 2.8%. He was educated low fat/cholesterol diet and exercise as tolerated. He will continue on current medication.       Return to clinic in about 3 months (02/14/2023), or if sympoms worsen or fail to improve.    Nilsa Nutting, FNP student

## 2022-11-23 NOTE — Patient Instructions (Addendum)
Calorie Counting for Weight Loss Calories are units of energy. Your body needs a certain number of calories from food to keep going throughout the day. When you eat or drink more calories than your body needs, your body stores the extra calories mostly as fat. When you eat or drink fewer calories than your body needs, your body burns fat to get the energy it needs. Calorie counting means keeping track of how many calories you eat and drink each day. Calorie counting can be helpful if you need to lose weight. If you eat fewer calories than your body needs, you should lose weight. Ask your health care provider what a healthy weight is for you. For calorie counting to work, you will need to eat the right number of calories each day to lose a healthy amount of weight per week. A dietitian can help you figure out how many calories you need in a day and will suggest ways to reach your calorie goal. A healthy amount of weight to lose each week is usually 1-2 lb (0.5-0.9 kg). This usually means that your daily calorie intake should be reduced by 500-750 calories. Eating 1,200-1,500 calories a day can help most women lose weight. Eating 1,500-1,800 calories a day can help most men lose weight. What do I need to know about calorie counting? Work with your health care provider or dietitian to determine how many calories you should get each day. To meet your daily calorie goal, you will need to: Find out how many calories are in each food that you would like to eat. Try to do this before you eat. Decide how much of the food you plan to eat. Keep a food log. Do this by writing down what you ate and how many calories it had. To successfully lose weight, it is important to balance calorie counting with a healthy lifestyle that includes regular activity. Where do I find calorie information?  The number of calories in a food can be found on a Nutrition Facts label. If a food does not have a Nutrition Facts label, try  to look up the calories online or ask your dietitian for help. Remember that calories are listed per serving. If you choose to have more than one serving of a food, you will have to multiply the calories per serving by the number of servings you plan to eat. For example, the label on a package of bread might say that a serving size is 1 slice and that there are 90 calories in a serving. If you eat 1 slice, you will have eaten 90 calories. If you eat 2 slices, you will have eaten 180 calories. How do I keep a food log? After each time that you eat, record the following in your food log as soon as possible: What you ate. Be sure to include toppings, sauces, and other extras on the food. How much you ate. This can be measured in cups, ounces, or number of items. How many calories were in each food and drink. The total number of calories in the food you ate. Keep your food log near you, such as in a pocket-sized notebook or on an app or website on your mobile phone. Some programs will calculate calories for you and show you how many calories you have left to meet your daily goal. What are some portion-control tips? Know how many calories are in a serving. This will help you know how many servings you can have of a certain  food. Use a measuring cup to measure serving sizes. You could also try weighing out portions on a kitchen scale. With time, you will be able to estimate serving sizes for some foods. Take time to put servings of different foods on your favorite plates or in your favorite bowls and cups so you know what a serving looks like. Try not to eat straight from a food's packaging, such as from a bag or box. Eating straight from the package makes it hard to see how much you are eating and can lead to overeating. Put the amount you would like to eat in a cup or on a plate to make sure you are eating the right portion. Use smaller plates, glasses, and bowls for smaller portions and to prevent  overeating. Try not to multitask. For example, avoid watching TV or using your computer while eating. If it is time to eat, sit down at a table and enjoy your food. This will help you recognize when you are full. It will also help you be more mindful of what and how much you are eating. What are tips for following this plan? Reading food labels Check the calorie count compared with the serving size. The serving size may be smaller than what you are used to eating. Check the source of the calories. Try to choose foods that are high in protein, fiber, and vitamins, and low in saturated fat, trans fat, and sodium. Shopping Read nutrition labels while you shop. This will help you make healthy decisions about which foods to buy. Pay attention to nutrition labels for low-fat or fat-free foods. These foods sometimes have the same number of calories or more calories than the full-fat versions. They also often have added sugar, starch, or salt to make up for flavor that was removed with the fat. Make a grocery list of lower-calorie foods and stick to it. Cooking Try to cook your favorite foods in a healthier way. For example, try baking instead of frying. Use low-fat dairy products. Meal planning Use more fruits and vegetables. One-half of your plate should be fruits and vegetables. Include lean proteins, such as chicken, [place], and fish. Lifestyle Each week, aim to do one of the following: 150 minutes of moderate exercise, such as walking. 75 minutes of vigorous exercise, such as running. General information Know how many calories are in the foods you eat most often. This will help you calculate calorie counts faster. Find a way of tracking calories that works for you. Get creative. Try different apps or programs if writing down calories does not work for you. What foods should I eat?  Eat nutritious foods. It is better to have a nutritious, high-calorie food, such as an avocado, than a food with  few nutrients, such as a bag of potato chips. Use your calories on foods and drinks that will fill you up and will not leave you hungry soon after eating. Examples of foods that fill you up are nuts and nut butters, vegetables, lean proteins, and high-fiber foods such as whole grains. High-fiber foods are foods with more than 5 g of fiber per serving. Pay attention to calories in drinks. Low-calorie drinks include water and unsweetened drinks. The items listed above may not be a complete list of foods and beverages you can eat. Contact a dietitian for more information. What foods should I limit? Limit foods or drinks that are not good sources of vitamins, minerals, or protein or that are high in unhealthy fats. These  include: Candy. Other sweets. Sodas, specialty coffee drinks, alcohol, and juice. The items listed above may not be a complete list of foods and beverages you should avoid. Contact a dietitian for more information. How do I count calories when eating out? Pay attention to portions. Often, portions are much larger when eating out. Try these tips to keep portions smaller: Consider sharing a meal instead of getting your own. If you get your own meal, eat only half of it. Before you start eating, ask for a container and put half of your meal into it. When available, consider ordering smaller portions from the menu instead of full portions. Pay attention to your food and drink choices. Knowing the way food is cooked and what is included with the meal can help you eat fewer calories. If calories are listed on the menu, choose the lower-calorie options. Choose dishes that include vegetables, fruits, whole grains, low-fat dairy products, and lean proteins. Choose items that are boiled, broiled, grilled, or steamed. Avoid items that are buttered, battered, fried, or served with cream sauce. Items labeled as crispy are usually fried, unless stated otherwise. Choose water, low-fat milk,  unsweetened iced tea, or other drinks without added sugar. If you want an alcoholic beverage, choose a lower-calorie option, such as a glass of wine or light beer. Ask for dressings, sauces, and syrups on the side. These are usually high in calories, so you should limit the amount you eat. If you want a salad, choose a garden salad and ask for grilled meats. Avoid extra toppings such as bacon, cheese, or fried items. Ask for the dressing on the side, or ask for olive oil and vinegar or lemon to use as dressing. Estimate how many servings of a food you are given. Knowing serving sizes will help you be aware of how much food you are eating at restaurants. Where to find more information Centers for Disease Control and Prevention: FootballExhibition.com.br U.[place]. Department of Agriculture: WrestlingReporter.dk Summary Calorie counting means keeping track of how many calories you eat and drink each day. If you eat fewer calories than your body needs, you should lose weight. A healthy amount of weight to lose per week is usually 1-2 lb (0.5-0.9 kg). This usually means reducing your daily calorie intake by 500-750 calories. The number of calories in a food can be found on a Nutrition Facts label. If a food does not have a Nutrition Facts label, try to look up the calories online or ask your dietitian for help. Use smaller plates, glasses, and bowls for smaller portions and to prevent overeating. Use your calories on foods and drinks that will fill you up and not leave you hungry shortly after a meal. This information is not intended to replace advice given to you by your health care provider. Make sure you discuss any questions you have with your health care provider. Document Revised: 04/03/2019 Document Reviewed: 04/03/2019 Elsevier Patient Education  2023 [employer] DASH Eating Plan DASH stands for Dietary Approaches to Stop Hypertension. The DASH eating plan is a healthy eating plan that has been shown to: Lower high  blood pressure (hypertension). Reduce your risk for type 2 diabetes, heart disease, and stroke. Help with weight loss. What are tips for following this plan? Reading food labels Check food labels for the amount of salt (sodium) per serving. Choose foods with less than 5 percent of the Daily Value (DV) of sodium. In general, foods with less than 300 milligrams (mg) of sodium per serving  fit into this eating plan. To find whole grains, look for the word "whole" as the first word in the ingredient list. Shopping Buy products labeled as "low-sodium" or "no salt added." Buy fresh foods. Avoid canned foods and pre-made or frozen meals. Cooking Try not to add salt when you cook. Use salt-free seasonings or herbs instead of table salt or sea salt. Check with your health care provider or pharmacist before using salt substitutes. Do not fry foods. Cook foods in healthy ways, such as baking, boiling, grilling, roasting, or broiling. Cook using oils that are good for your heart. These include olive, canola, avocado, soybean, and sunflower oil. Meal planning  Eat a balanced diet. This should include: 4 or more servings of fruits and 4 or more servings of vegetables each day. Try to fill half of your plate with fruits and vegetables. 6-8 servings of whole grains each day. 6 or less servings of lean meat, poultry, or fish each day. 1 oz is 1 serving. A 3 oz (85 g) serving of meat is about the same size as the palm of your hand. One egg is 1 oz (28 g). 2-3 servings of low-fat dairy each day. One serving is 1 cup (237 mL). 1 serving of nuts, seeds, or beans 5 times each week. 2-3 servings of heart-healthy fats. Healthy fats called omega-3 fatty acids are found in foods such as walnuts, flaxseeds, fortified milks, and eggs. These fats are also found in cold-water fish, such as sardines, salmon, and mackerel. Limit how much you eat of: Canned or prepackaged foods. Food that is high in trans fat, such as fried  foods. Food that is high in saturated fat, such as fatty meat. Desserts and other sweets, sugary drinks, and other foods with added sugar. Full-fat dairy products. Do not salt foods before eating. Do not eat more than 4 egg yolks a week. Try to eat at least 2 vegetarian meals a week. Eat more home-cooked food and less restaurant, buffet, and fast food. Lifestyle When eating at a restaurant, ask if your food can be made with less salt or no salt. If you drink alcohol: Limit how much you have to: 0-1 drink a day if you are male. 0-2 drinks a day if you are male. Know how much alcohol is in your drink. In the [place] one drink is one 12 oz bottle of beer (355 mL), one 5 oz glass of wine (148 mL), or one 1 oz glass of hard liquor (44 mL). General information Avoid eating more than 2,300 mg of salt a day. If you have hypertension, you may need to reduce your sodium intake to 1,500 mg a day. Work with your provider to stay at a healthy body weight or lose weight. Ask what the best weight range is for you. On most days of the week, get at least 30 minutes of exercise that causes your heart to beat faster. This may include walking, swimming, or biking. Work with your provider or dietitian to adjust your eating plan to meet your specific calorie needs. What foods should I eat? Fruits All fresh, dried, or frozen fruit. Canned fruits that are in their natural juice and do not have sugar added to them. Vegetables Fresh or frozen vegetables that are raw, steamed, roasted, or grilled. Low-sodium or reduced-sodium tomato and vegetable juice. Low-sodium or reduced-sodium tomato sauce and tomato paste. Low-sodium or reduced-sodium canned vegetables. Grains Whole-grain or whole-wheat bread. Whole-grain or whole-wheat pasta. Brown rice. Orpah Cobb. Bulgur. Whole-grain  and low-sodium cereals. Pita bread. Low-fat, low-sodium crackers. Whole-wheat flour tortillas. Meats and other proteins Skinless  chicken or [place]. Ground chicken or [place]. Pork with fat trimmed off. Fish and seafood. Egg whites. Dried beans, peas, or lentils. Unsalted nuts, nut butters, and seeds. Unsalted canned beans. Lean cuts of beef with fat trimmed off. Low-sodium, lean precooked or cured meat, such as sausages or meat loaves. Dairy Low-fat (1%) or fat-free (skim) milk. Reduced-fat, low-fat, or fat-free cheeses. Nonfat, low-sodium ricotta or cottage cheese. Low-fat or nonfat yogurt. Low-fat, low-sodium cheese. Fats and oils Soft margarine without trans fats. Vegetable oil. Reduced-fat, low-fat, or light mayonnaise and salad dressings (reduced-sodium). Canola, safflower, olive, avocado, soybean, and sunflower oils. Avocado. Seasonings and condiments Herbs. Spices. Seasoning mixes without salt. Other foods Unsalted popcorn and pretzels. Fat-free sweets. The items listed above may not be all the foods and drinks you can have. Talk to a dietitian to learn more. What foods should I avoid? Fruits Canned fruit in a light or heavy syrup. Fried fruit. Fruit in cream or butter sauce. Vegetables Creamed or fried vegetables. Vegetables in a cheese sauce. Regular canned vegetables that are not marked as low-sodium or reduced-sodium. Regular canned tomato sauce and paste that are not marked as low-sodium or reduced-sodium. Regular tomato and vegetable juices that are not marked as low-sodium or reduced-sodium. Rosita Fire. Olives. Grains Baked goods made with fat, such as croissants, muffins, or some breads. Dry pasta or rice meal packs. Meats and other proteins Fatty cuts of meat. Ribs. Fried meat. Tomasa Blase. Bologna, salami, and other precooked or cured meats, such as sausages or meat loaves, that are not lean and low in sodium. Fat from the back of a pig (fatback). Bratwurst. Salted nuts and seeds. Canned beans with added salt. Canned or smoked fish. Whole eggs or egg yolks. Chicken or [place] with skin. Dairy Whole or 2% milk, cream,  and half-and-half. Whole or full-fat cream cheese. Whole-fat or sweetened yogurt. Full-fat cheese. Nondairy creamers. Whipped toppings. Processed cheese and cheese spreads. Fats and oils Butter. Stick margarine. Lard. Shortening. Ghee. Bacon fat. Tropical oils, such as coconut, palm kernel, or palm oil. Seasonings and condiments Onion salt, garlic salt, seasoned salt, table salt, and sea salt. Worcestershire sauce. Tartar sauce. Barbecue sauce. Teriyaki sauce. Soy sauce, including reduced-sodium soy sauce. Steak sauce. Canned and packaged gravies. Fish sauce. Oyster sauce. Cocktail sauce. Store-bought horseradish. Ketchup. Mustard. Meat flavorings and tenderizers. Bouillon cubes. Hot sauces. Pre-made or packaged marinades. Pre-made or packaged taco seasonings. Relishes. Regular salad dressings. Other foods Salted popcorn and pretzels. The items listed above may not be all the foods and drinks you should avoid. Talk to a dietitian to learn more. Where to find more information National Heart, Lung, and Blood Institute (NHLBI): BuffaloDryCleaner.gl American Heart Association (AHA): heart.org Academy of Nutrition and Dietetics: eatright.org National Kidney Foundation (NKF): kidney.org This information is not intended to replace advice given to you by your health care provider. Make sure you discuss any questions you have with your health care provider. Document Revised: 03/09/2022 Document Reviewed: 03/09/2022 Elsevier Patient Education  2024 [employer].

## 2023-01-22 ENCOUNTER — Other Ambulatory Visit: Payer: Self-pay

## 2023-02-12 ENCOUNTER — Other Ambulatory Visit: Payer: Self-pay

## 2023-02-15 ENCOUNTER — Ambulatory Visit: Payer: Self-pay | Admitting: Gerontology

## 2023-03-27 ENCOUNTER — Ambulatory Visit: Payer: Self-pay | Admitting: Gerontology

## 2023-04-18 ENCOUNTER — Other Ambulatory Visit: Payer: Self-pay

## 2023-04-18 ENCOUNTER — Other Ambulatory Visit: Payer: Self-pay | Admitting: Gerontology

## 2023-04-18 DIAGNOSIS — Z8639 Personal history of other endocrine, nutritional and metabolic disease: Secondary | ICD-10-CM

## 2023-04-18 DIAGNOSIS — I10 Essential (primary) hypertension: Secondary | ICD-10-CM

## 2023-04-18 MED FILL — Metoprolol Succinate Tab ER 24HR 25 MG (Tartrate Equiv): ORAL | 90 days supply | Qty: 90 | Fill #0 | Status: AC

## 2023-04-18 MED FILL — Amlodipine Besylate Tab 5 MG (Base Equivalent): ORAL | 90 days supply | Qty: 90 | Fill #0 | Status: AC

## 2023-04-18 MED FILL — Atorvastatin Calcium Tab 80 MG (Base Equivalent): ORAL | 90 days supply | Qty: 90 | Fill #0 | Status: AC

## 2023-04-19 ENCOUNTER — Ambulatory Visit: Payer: Self-pay | Admitting: Gerontology

## 2023-04-19 ENCOUNTER — Encounter: Payer: Self-pay | Admitting: Gerontology

## 2023-04-19 ENCOUNTER — Other Ambulatory Visit: Payer: Self-pay

## 2023-04-19 VITALS — BP 136/76 | HR 83 | Temp 98.0°F | Resp 16 | Wt 337.0 lb

## 2023-04-19 DIAGNOSIS — Z0000 Encounter for general adult medical examination without abnormal findings: Secondary | ICD-10-CM

## 2023-04-19 DIAGNOSIS — Z8719 Personal history of other diseases of the digestive system: Secondary | ICD-10-CM

## 2023-04-19 DIAGNOSIS — I10 Essential (primary) hypertension: Secondary | ICD-10-CM

## 2023-04-19 MED ORDER — LOSARTAN POTASSIUM 50 MG PO TABS
50.0000 mg | ORAL_TABLET | Freq: Every day | ORAL | 1 refills | Status: DC
  Filled 2023-04-19 – 2023-05-09 (×2): qty 90, 90d supply, fill #0
  Filled 2023-08-07: qty 90, 90d supply, fill #1

## 2023-04-19 MED ORDER — OMEPRAZOLE 20 MG PO CPDR
20.0000 mg | DELAYED_RELEASE_CAPSULE | Freq: Every day | ORAL | 1 refills | Status: DC
  Filled 2023-04-19 – 2023-05-09 (×2): qty 90, 90d supply, fill #0
  Filled 2023-08-07: qty 90, 90d supply, fill #1

## 2023-04-19 NOTE — Patient Instructions (Signed)

## 2023-04-19 NOTE — Progress Notes (Signed)
Established Patient Office Visit  Subjective   Patient ID: Logan Ball, male    DOB: 05/20/1980  Age: 43 y.o. MRN: [id number]  No chief complaint on file.   HPI Logan D Spinney is a 42y/o male who has history of hypertension, hyperlipidemia, morbid obesity, acid reflux, presents for routine follow up and medication refill. He reports hat he has been compliant with medications as prescribed. He checks his blood pressure daily and he brought to the clinic today a log of his readings. His systolic ranges in the 120's and diastolic in the 80's. He denies any chest pain, headaches, shortness of breath or blurry vision. He denies exercising but he has been eating more health foods- more grilled and low salt diet.  Overall, patient reports that he doing well with no further complains.   Review of Systems  Constitutional: Negative.   HENT: Negative.    Eyes: Negative.   Respiratory: Negative.    Cardiovascular: Negative.   Gastrointestinal: Negative.   Genitourinary: Negative.   Musculoskeletal: Negative.   Skin: Negative.   Neurological: Negative.   Psychiatric/Behavioral: Negative.        Objective:     BP 136/76 (BP Location: Right Arm, Patient Position: Sitting, Cuff Size: Large)   Pulse 83   Temp 98 F (36.7 C)   Resp 16   Wt (!) 337 lb (152.9 kg)   BMI 48.35 kg/m  BP Readings from Last 3 Encounters:  04/19/23 136/76  11/23/22 119/78  09/28/22 125/78   Wt Readings from Last 3 Encounters:  04/19/23 (!) 337 lb (152.9 kg)  11/23/22 (!) 347 lb (157.4 kg)  09/28/22 (!) 353 lb (160.1 kg)      Physical Exam HENT:     Head: Normocephalic and atraumatic.     Mouth/Throat:     Mouth: Mucous membranes are moist.     Pharynx: Oropharynx is clear.  Eyes:     Extraocular Movements: Extraocular movements intact.     Conjunctiva/sclera: Conjunctivae normal.     Pupils: Pupils are equal, round, and reactive to light.  Cardiovascular:     Rate and Rhythm: Normal rate  and regular rhythm.     Pulses: Normal pulses.     Heart sounds: Normal heart sounds.  Pulmonary:     Effort: Pulmonary effort is normal.     Breath sounds: Normal breath sounds.  Abdominal:     General: Bowel sounds are normal.     Palpations: Abdomen is soft.  Skin:    General: Skin is warm.     Capillary Refill: Capillary refill takes less than 2 seconds.  Neurological:     General: No focal deficit present.     Mental Status: He is alert and oriented to person, place, and time. Mental status is at baseline.  Psychiatric:        Mood and Affect: Mood normal.        Behavior: Behavior normal.        Thought Content: Thought content normal.        Judgment: Judgment normal.      No results found for any visits on 04/19/23.  Last CBC Lab Results  Component Value Date   WBC 5.6 03/29/2022   HGB 14.8 03/29/2022   HCT 42.5 03/29/2022   MCV 83 03/29/2022   MCH 28.7 03/29/2022   RDW 13.0 03/29/2022   PLT 276 03/29/2022   Last metabolic panel Lab Results  Component Value Date   GLUCOSE 110 (H)  10/04/2022   NA 142 10/04/2022   K 4.3 10/04/2022   CL 108 (H) 10/04/2022   CO2 19 (L) 10/04/2022   BUN 14 10/04/2022   CREATININE 0.91 10/04/2022   EGFR 107 10/04/2022   CALCIUM 9.6 10/04/2022   PROT 7.1 10/04/2022   ALBUMIN 4.5 10/04/2022   LABGLOB 2.6 10/04/2022   AGRATIO 1.6 03/29/2022   BILITOT 0.5 10/04/2022   ALKPHOS 112 10/04/2022   AST 25 10/04/2022   ALT 23 10/04/2022   ANIONGAP 6 08/18/2019   Last lipids Lab Results  Component Value Date   CHOL CANCELED 10/04/2022   CHOL 161 10/04/2022   HDL CANCELED 10/04/2022   HDL 25 (L) 10/04/2022   LDLCALC 83 10/04/2022   TRIG CANCELED 10/04/2022   TRIG 323 (H) 10/04/2022   CHOLHDL 6.4 (H) 10/04/2022   Last hemoglobin A1c Lab Results  Component Value Date   HGBA1C 4.9 10/04/2022   Last thyroid functions Lab Results  Component Value Date   TSH 1.090 10/04/2022   Last vitamin D No results found for:  "25OHVITD2", "25OHVITD3", "VD25OH" Last vitamin B12 and Folate No results found for: "VITAMINB12", "FOLATE"    The 10-year ASCVD risk score (Arnett DK, et al., 2019) is: 3.5%    Assessment & Plan:  1. Essential hypertension (Primary) His blood pressure is under control and patient will continue current medications as prescribed, follow a DASH diet and exercise as tolerated. Has a BP monitor home. Patient encouraged to continue checking blood pressure at home daily, log readings and bring to next appointment.  - losartan (COZAAR) 50 MG tablet; Take 1 tablet (50 mg total) by mouth daily.  Dispense: 90 tablet; Refill: 1  2. History of gastroesophageal reflux (GERD) - His acid reflux is under control, he will continue to take medication as prescribed. -Avoid spicy, fatty and fried food -Avoid sodas and sour juices -Avoid heavy meals -Avoid eating 4 hours before bedtime -Elevate head of bed at night  - omeprazole (PRILOSEC) 20 MG capsule; Take 1 capsule (20 mg total) by mouth once daily.  Dispense: 90 capsule; Refill: 1  3. Morbid obesity (HCC) - He was congratulated for losing 10 lbs, encouraged to continue losing weight, follow a healthy diet, portion sizes and exercise as tolerated.   4. Healthcare maintenance Scheduled for routine labs on 08/29/23 and follow up at the clinic on 09/04/2023. - Lipid Panel w/o Chol/HDL Ratio; Future - Comp Met (CMET); Future - CBC w/Diff; Future - HgB A1c; Future - TSH; Future    Return in about 4 months (around 08/29/2023), or if symptoms worsen or fail to improve.    Chioma Trellis Paganini, NP

## 2023-04-20 ENCOUNTER — Other Ambulatory Visit: Payer: Self-pay

## 2023-05-02 ENCOUNTER — Ambulatory Visit: Payer: Self-pay | Admitting: Gerontology

## 2023-05-09 ENCOUNTER — Other Ambulatory Visit: Payer: Self-pay

## 2023-05-17 ENCOUNTER — Ambulatory Visit: Payer: Self-pay | Admitting: Gerontology

## 2023-07-12 ENCOUNTER — Other Ambulatory Visit: Payer: Self-pay

## 2023-07-12 MED FILL — Atorvastatin Calcium Tab 80 MG (Base Equivalent): ORAL | 90 days supply | Qty: 90 | Fill #1 | Status: AC

## 2023-07-12 MED FILL — Amlodipine Besylate Tab 5 MG (Base Equivalent): ORAL | 90 days supply | Qty: 90 | Fill #1 | Status: AC

## 2023-07-12 MED FILL — Metoprolol Succinate Tab ER 24HR 25 MG (Tartrate Equiv): ORAL | 90 days supply | Qty: 90 | Fill #1 | Status: AC

## 2023-07-13 ENCOUNTER — Other Ambulatory Visit: Payer: Self-pay

## 2023-08-07 ENCOUNTER — Other Ambulatory Visit: Payer: Self-pay

## 2023-08-29 ENCOUNTER — Other Ambulatory Visit: Payer: Self-pay

## 2023-09-04 ENCOUNTER — Ambulatory Visit: Payer: Self-pay | Admitting: Gerontology

## 2023-09-04 ENCOUNTER — Telehealth: Payer: Self-pay | Admitting: Gerontology

## 2023-09-04 ENCOUNTER — Other Ambulatory Visit: Payer: Self-pay

## 2023-09-04 DIAGNOSIS — Z0000 Encounter for general adult medical examination without abnormal findings: Secondary | ICD-10-CM

## 2023-09-04 NOTE — Telephone Encounter (Signed)
 Called to confirm upcoming appointment this Thursday. Call was answered and appointment confirmed.

## 2023-09-05 LAB — COMPREHENSIVE METABOLIC PANEL WITH GFR
ALT: 13 IU/L (ref 0–44)
AST: 18 IU/L (ref 0–40)
Albumin: 4.2 g/dL (ref 4.1–5.1)
Alkaline Phosphatase: 108 IU/L (ref 44–121)
BUN/Creatinine Ratio: 13 (ref 9–20)
BUN: 12 mg/dL (ref 6–24)
Bilirubin Total: 0.7 mg/dL (ref 0.0–1.2)
CO2: 19 mmol/L — ABNORMAL LOW (ref 20–29)
Calcium: 9.3 mg/dL (ref 8.7–10.2)
Chloride: 108 mmol/L — ABNORMAL HIGH (ref 96–106)
Creatinine, Ser: 0.93 mg/dL (ref 0.76–1.27)
Globulin, Total: 2.5 g/dL (ref 1.5–4.5)
Glucose: 89 mg/dL (ref 70–99)
Potassium: 4.6 mmol/L (ref 3.5–5.2)
Sodium: 141 mmol/L (ref 134–144)
Total Protein: 6.7 g/dL (ref 6.0–8.5)
eGFR: 104 mL/min/{1.73_m2} (ref >59)

## 2023-09-05 LAB — TSH: TSH: 1.05 u[IU]/mL (ref 0.450–4.500)

## 2023-09-05 LAB — CBC WITH DIFFERENTIAL/PLATELET
Basophils Absolute: 0 10*3/uL (ref 0.0–0.2)
Basos: 1 %
EOS (ABSOLUTE): 0.2 10*3/uL (ref 0.0–0.4)
Eos: 3 %
Hematocrit: 42.8 % (ref 37.5–51.0)
Hemoglobin: 14.4 g/dL (ref 13.0–17.7)
Immature Grans (Abs): 0 10*3/uL (ref 0.0–0.1)
Immature Granulocytes: 0 %
Lymphocytes Absolute: 1.2 10*3/uL (ref 0.7–3.1)
Lymphs: 23 %
MCH: 29.2 pg (ref 26.6–33.0)
MCHC: 33.6 g/dL (ref 31.5–35.7)
MCV: 87 fL (ref 79–97)
Monocytes Absolute: 0.4 10*3/uL (ref 0.1–0.9)
Monocytes: 8 %
Neutrophils Absolute: 3.4 10*3/uL (ref 1.4–7.0)
Neutrophils: 64 %
Platelets: 277 10*3/uL (ref 150–450)
RBC: 4.93 x10E6/uL (ref 4.14–5.80)
RDW: 13.1 % (ref 11.6–15.4)
WBC: 5.2 10*3/uL (ref 3.4–10.8)

## 2023-09-05 LAB — LIPID PANEL W/O CHOL/HDL RATIO
Cholesterol, Total: 142 mg/dL (ref 100–199)
HDL: 24 mg/dL — ABNORMAL LOW (ref >39)
LDL Chol Calc (NIH): 68 mg/dL (ref 0–99)
Triglycerides: 310 mg/dL — ABNORMAL HIGH (ref 0–149)
VLDL Cholesterol Cal: 50 mg/dL — ABNORMAL HIGH (ref 5–40)

## 2023-09-05 LAB — HEMOGLOBIN A1C
Est. average glucose Bld gHb Est-mCnc: 91 mg/dL
Hgb A1c MFr Bld: 4.8 % (ref 4.8–5.6)

## 2023-09-06 ENCOUNTER — Ambulatory Visit: Payer: Self-pay | Admitting: Gerontology

## 2023-09-06 ENCOUNTER — Encounter: Payer: Self-pay | Admitting: Gerontology

## 2023-09-06 ENCOUNTER — Other Ambulatory Visit: Payer: Self-pay

## 2023-09-06 VITALS — BP 128/83 | HR 66 | Ht 71.0 in | Wt 325.0 lb

## 2023-09-06 DIAGNOSIS — Z8639 Personal history of other endocrine, nutritional and metabolic disease: Secondary | ICD-10-CM

## 2023-09-06 DIAGNOSIS — Z8719 Personal history of other diseases of the digestive system: Secondary | ICD-10-CM

## 2023-09-06 DIAGNOSIS — I10 Essential (primary) hypertension: Secondary | ICD-10-CM

## 2023-09-06 MED ORDER — AMLODIPINE BESYLATE 5 MG PO TABS
5.0000 mg | ORAL_TABLET | Freq: Every day | ORAL | 1 refills | Status: DC
  Filled 2023-09-06 – 2023-10-12 (×2): qty 90, 90d supply, fill #0
  Filled 2023-12-25: qty 90, 90d supply, fill #1

## 2023-09-06 MED ORDER — ESOMEPRAZOLE MAGNESIUM 20 MG PO CPDR
20.0000 mg | DELAYED_RELEASE_CAPSULE | Freq: Every day | ORAL | 1 refills | Status: AC
  Filled 2023-09-06: qty 30, 30d supply, fill #0
  Filled 2023-10-12: qty 30, 30d supply, fill #1

## 2023-09-06 MED ORDER — METOPROLOL SUCCINATE ER 25 MG PO TB24
25.0000 mg | ORAL_TABLET | Freq: Every day | ORAL | 1 refills | Status: AC
  Filled 2023-09-06 – 2023-10-12 (×2): qty 90, 90d supply, fill #0
  Filled 2023-12-25: qty 30, 30d supply, fill #1
  Filled 2024-02-04 – 2024-02-08 (×2): qty 30, 30d supply, fill #2
  Filled 2024-03-10 – 2024-04-02 (×4): qty 30, 30d supply, fill #3

## 2023-09-06 MED ORDER — LOSARTAN POTASSIUM 50 MG PO TABS
50.0000 mg | ORAL_TABLET | Freq: Every day | ORAL | 1 refills | Status: AC
  Filled 2023-09-06 – 2023-11-08 (×2): qty 90, 90d supply, fill #0
  Filled 2024-02-04 – 2024-02-08 (×2): qty 90, 90d supply, fill #1

## 2023-09-06 MED ORDER — ATORVASTATIN CALCIUM 20 MG PO TABS
80.0000 mg | ORAL_TABLET | Freq: Every day | ORAL | 1 refills | Status: DC
  Filled 2023-09-06: qty 360, 90d supply, fill #0
  Filled 2023-12-11 (×2): qty 360, 90d supply, fill #1

## 2023-09-06 NOTE — Progress Notes (Signed)
 Established Patient Office Visit  Subjective   Patient ID: Logan Ball, male    DOB: [birth year]  Age: 43 y.o. MRN: [id number]  Chief Complaint  Patient presents with   Follow-up    htn    HPI  Logan Ball is a 43 y/o male with history of hypertension, hyperlipidemia, morbid obesity and acid reflux. He presents in the clinic  for blood work follow up visit. His lab done on 09/04/23, HDL decrease from 25 to 24 mg/dL, his triglycerides decreased from 323 to 310 mg/ dL and the rest was unremarkable. He reports hat he has been compliant with medications as prescribed, denies side effects and continues to make healthy lifestyle changes. He checks his blood pressure daily and he brought his log. His systolic blood pressure ranges  from 118 to 145  and diastolic blood pressure ranges from 76 to 83. His blood pressure 123/ 83 today at the visit.   He denies any chest pain, headaches, shortness of breath or blurry vision. Overall, he reports that he is doing well with no further  Questions.     Review of Systems  Constitutional: Negative.   HENT: Negative.    Eyes: Negative.   Respiratory: Negative.    Cardiovascular: Negative.   Gastrointestinal: Negative.   Genitourinary: Negative.   Musculoskeletal: Negative.   Skin: Negative.   Neurological: Negative.   Psychiatric/Behavioral: Negative.        Objective:     BP 128/83   Pulse 66   Ht 5' 11 (1.803 m)   Wt (!) 325 lb (147.4 kg)   SpO2 95%   BMI 45.33 kg/m  BP Readings from Last 3 Encounters:  09/06/23 128/83  04/19/23 136/76  11/23/22 119/78   Wt Readings from Last 3 Encounters:  09/06/23 (!) 325 lb (147.4 kg)  04/19/23 (!) 337 lb (152.9 kg)  11/23/22 (!) 347 lb (157.4 kg)   SpO2 Readings from Last 3 Encounters:  09/06/23 95%  11/23/22 93%  04/05/22 95%      Physical Exam HENT:     Head: Normocephalic and atraumatic.  Cardiovascular:     Rate and Rhythm: Normal rate and regular rhythm.     Pulses:  Normal pulses.     Heart sounds: Normal heart sounds.  Pulmonary:     Effort: Pulmonary effort is normal.     Breath sounds: Normal breath sounds.  Abdominal:     General: Bowel sounds are normal.     Palpations: Abdomen is soft.  Skin:    General: Skin is warm and dry.     Capillary Refill: Capillary refill takes less than 2 seconds.  Neurological:     General: No focal deficit present.     Mental Status: He is alert and oriented to person, place, and time.  Psychiatric:        Mood and Affect: Mood normal.        Behavior: Behavior normal.      No results found for any visits on 09/06/23.  Last hemoglobin A1c Lab Results  Component Value Date   HGBA1C 4.8 09/04/2023   Last thyroid functions Lab Results  Component Value Date   TSH 1.050 09/04/2023   Last vitamin D No results found for: 25OHVITD2, 25OHVITD3, VD25OH Last vitamin B12 and Folate No results found for: VITAMINB12, FOLATE    The 10-year ASCVD risk score (Arnett DK, et al., 2019) is: 3%    Assessment & Plan:   .SABRA1. Essential hypertension (Primary) -  His blood pressure is improving, - he encourage to continue monitoring BP daily - encourage to follow Dash diet. - encourage to take medication daily, as prescribed.  - amLODipine  (NORVASC ) 5 MG tablet; Take 1 tablet (5 mg total) by mouth daily.  Dispense: 90 tablet; Refill: 1 - losartan  (COZAAR ) 50 MG tablet; Take 1 tablet (50 mg total) by mouth daily.  Dispense: 90 tablet; Refill: 1 - metoprolol  succinate (TOPROL -XL) 25 MG 24 hr tablet; Take 1 tablet (25 mg total) by mouth daily.  Dispense: 90 tablet; Refill: 1  2. History of elevated lipids -The 10-year ASCVD risk score (Arnett DK, et al., 2019) is: 3%   Values used to calculate the score:     Age: 73 years     Clincally relevant sex: Male     Is Non-Hispanic African American: No     Diabetic: No     Tobacco smoker: No     Systolic Blood Pressure: 128 mmHg     Is BP treated: Yes     HDL  Cholesterol: 24 mg/dL     Total Cholesterol: 142 mg/dL His ASCVD risk is 3%, - he  encourage to continue make healthy  food choices. - he encourage to start exercise routine. -  continue medication as ordered. - atorvastatin  (LIPITOR) 20 MG tablet; Take 4 tablets (80 mg total) by mouth daily.  Dispense: 360 tablet; Refill: 1  3. Morbid obesity (HCC) - He lost 12 pounds in 3 months, he was encouraged to follow low calorie count diet. - Use portion sized plate - encourage walk 3x / week.   4. History of gastroesophageal reflux (GERD) -Avoid spicy, fatty and fried food -Avoid sodas and sour juices -Avoid heavy meals -Avoid eating 4 hours before bedtime -Elevate head of bed at night - encourage to avoid to lay down after meals.  - encourage  to avoid spicy foods. - encourage continue medication as ordered - esomeprazole (NEXIUM) 20 MG capsule; Take 1 capsule (20 mg total) by mouth daily at 12 noon.  Dispense: 90 capsule; Refill: 1      Return in about 13 weeks (around 12/06/2023) for in person.    [name], NP

## 2023-09-06 NOTE — Patient Instructions (Signed)
 Managing Hyperlipidemia After viewing this video, you will be able to describe how our body uses cholesterol, and the diagnosis and treatment of hyperlipidemia. To view the content, go to this web address: [email or web address]  This video will expire on: 02/14/2025. If you need access to this video following this date, please reach out to the healthcare provider who assigned it to you. This information is not intended to replace advice given to you by your health care provider. Make sure you discuss any questions you have with your health care provider. Elsevier Patient Education  2024 Elsevier Inc.DASH Eating Plan DASH stands for Dietary Approaches to Stop Hypertension. The DASH eating plan is a healthy eating plan that has been shown to: Lower high blood pressure (hypertension). Reduce your risk for type 2 diabetes, heart disease, and stroke. Help with weight loss. What are tips for following this plan? Reading food labels Check food labels for the amount of salt (sodium) per serving. Choose foods with less than 5 percent of the Daily Value (DV) of sodium. In general, foods with less than 300 milligrams (mg) of sodium per serving fit into this eating plan. To find whole grains, look for the word whole as the first word in the ingredient list. Shopping Buy products labeled as low-sodium or no salt added. Buy fresh foods. Avoid canned foods and pre-made or frozen meals. Cooking Try not to add salt when you cook. Use salt-free seasonings or herbs instead of table salt or sea salt. Check with your health care provider or pharmacist before using salt substitutes. Do not fry foods. Cook foods in healthy ways, such as baking, boiling, grilling, roasting, or broiling. Cook using oils that are good for your heart. These include olive, canola, avocado, soybean, and sunflower oil. Meal planning  Eat a balanced diet. This should include: 4 or more servings of fruits and 4 or more  servings of vegetables each day. Try to fill half of your plate with fruits and vegetables. 6-8 servings of whole grains each day. 6 or less servings of lean meat, poultry, or fish each day. 1 oz is 1 serving. A 3 oz (85 g) serving of meat is about the same size as the palm of your hand. One egg is 1 oz (28 g). 2-3 servings of low-fat dairy each day. One serving is 1 cup (237 mL). 1 serving of nuts, seeds, or beans 5 times each week. 2-3 servings of heart-healthy fats. Healthy fats called omega-3 fatty acids are found in foods such as walnuts, flaxseeds, fortified milks, and eggs. These fats are also found in cold-water fish, such as sardines, salmon, and mackerel. Limit how much you eat of: Canned or prepackaged foods. Food that is high in trans fat, such as fried foods. Food that is high in saturated fat, such as fatty meat. Desserts and other sweets, sugary drinks, and other foods with added sugar. Full-fat dairy products. Do not salt foods before eating. Do not eat more than 4 egg yolks a week. Try to eat at least 2 vegetarian meals a week. Eat more home-cooked food and less restaurant, buffet, and fast food. Lifestyle When eating at a restaurant, ask if your food can be made with less salt or no salt. If you drink alcohol: Limit how much you have to: 0-1 drink a day if you are male. 0-2 drinks a day if you are male. Know how much alcohol is in your drink. In the [place] one drink is one 12 oz bottle of beer (  355 mL), one 5 oz glass of wine (148 mL), or one 1 oz glass of hard liquor (44 mL). General information Avoid eating more than 2,300 mg of salt a day. If you have hypertension, you may need to reduce your sodium intake to 1,500 mg a day. Work with your provider to stay at a healthy body weight or lose weight. Ask what the best weight range is for you. On most days of the week, get at least 30 minutes of exercise that causes your heart to beat faster. This may include walking,  swimming, or biking. Work with your provider or dietitian to adjust your eating plan to meet your specific calorie needs. What foods should I eat? Fruits All fresh, dried, or frozen fruit. Canned fruits that are in their natural juice and do not have sugar added to them. Vegetables Fresh or frozen vegetables that are raw, steamed, roasted, or grilled. Low-sodium or reduced-sodium tomato and vegetable juice. Low-sodium or reduced-sodium tomato sauce and tomato paste. Low-sodium or reduced-sodium canned vegetables. Grains Whole-grain or whole-wheat bread. Whole-grain or whole-wheat pasta. Brown rice. [place]. Bulgur. Whole-grain and low-sodium cereals. Pita bread. Low-fat, low-sodium crackers. Whole-wheat flour tortillas. Meats and other proteins Skinless chicken or [place]. Ground chicken or [place]. Pork with fat trimmed off. Fish and seafood. Egg whites. Dried beans, peas, or lentils. Unsalted nuts, nut butters, and seeds. Unsalted canned beans. Lean cuts of beef with fat trimmed off. Low-sodium, lean precooked or cured meat, such as sausages or meat loaves. Dairy Low-fat (1%) or fat-free (skim) milk. Reduced-fat, low-fat, or fat-free cheeses. Nonfat, low-sodium ricotta or cottage cheese. Low-fat or nonfat yogurt. Low-fat, low-sodium cheese. Fats and oils Soft margarine without trans fats. Vegetable oil. Reduced-fat, low-fat, or light mayonnaise and salad dressings (reduced-sodium). Canola, safflower, olive, avocado, soybean, and sunflower oils. Avocado. Seasonings and condiments Herbs. Spices. Seasoning mixes without salt. Other foods Unsalted popcorn and pretzels. Fat-free sweets. The items listed above may not be all the foods and drinks you can have. Talk to a dietitian to learn more. What foods should I avoid? Fruits Canned fruit in a light or heavy syrup. Fried fruit. Fruit in cream or butter sauce. Vegetables Creamed or fried vegetables. Vegetables in a cheese sauce. Regular  canned vegetables that are not marked as low-sodium or reduced-sodium. Regular canned tomato sauce and paste that are not marked as low-sodium or reduced-sodium. Regular tomato and vegetable juices that are not marked as low-sodium or reduced-sodium. Dene. Olives. Grains Baked goods made with fat, such as croissants, muffins, or some breads. Dry pasta or rice meal packs. Meats and other proteins Fatty cuts of meat. Ribs. Fried meat. [place]. Bologna, salami, and other precooked or cured meats, such as sausages or meat loaves, that are not lean and low in sodium. Fat from the back of a pig (fatback). Bratwurst. Salted nuts and seeds. Canned beans with added salt. Canned or smoked fish. Whole eggs or egg yolks. Chicken or [place] with skin. Dairy Whole or 2% milk, cream, and half-and-half. Whole or full-fat cream cheese. Whole-fat or sweetened yogurt. Full-fat cheese. Nondairy creamers. Whipped toppings. Processed cheese and cheese spreads. Fats and oils Butter. Stick margarine. Lard. Shortening. Ghee. Bacon fat. Tropical oils, such as coconut, palm kernel, or palm oil. Seasonings and condiments Onion salt, garlic salt, seasoned salt, table salt, and sea salt. Worcestershire sauce. Tartar sauce. Barbecue sauce. Teriyaki sauce. Soy sauce, including reduced-sodium soy sauce. Steak sauce. Canned and packaged gravies. Fish sauce. Oyster sauce. Cocktail sauce. Store-bought horseradish.  Ketchup. Mustard. Meat flavorings and tenderizers. Bouillon cubes. Hot sauces. Pre-made or packaged marinades. Pre-made or packaged taco seasonings. Relishes. Regular salad dressings. Other foods Salted popcorn and pretzels. The items listed above may not be all the foods and drinks you should avoid. Talk to a dietitian to learn more. Where to find more information National Heart, Lung, and Blood Institute (NHLBI): BuffaloDryCleaner.gl American Heart Association (AHA): heart.org Academy of Nutrition and Dietetics:  eatright.org National Kidney Foundation (NKF): kidney.org This information is not intended to replace advice given to you by your health care provider. Make sure you discuss any questions you have with your health care provider. Document Revised: 03/09/2022 Document Reviewed: 03/09/2022 Elsevier Patient Education  2024 [employer].

## 2023-10-12 ENCOUNTER — Other Ambulatory Visit: Payer: Self-pay

## 2023-11-08 ENCOUNTER — Other Ambulatory Visit: Payer: Self-pay

## 2023-11-29 ENCOUNTER — Telehealth: Payer: Self-pay | Admitting: Gerontology

## 2023-11-29 NOTE — Telephone Encounter (Signed)
 Received call from pt to cancel apt on 10/2 and will call back at later date

## 2023-12-06 ENCOUNTER — Ambulatory Visit: Payer: Self-pay | Admitting: Gerontology

## 2023-12-11 ENCOUNTER — Other Ambulatory Visit: Payer: Self-pay

## 2023-12-11 ENCOUNTER — Other Ambulatory Visit (HOSPITAL_COMMUNITY): Payer: Self-pay

## 2023-12-12 ENCOUNTER — Other Ambulatory Visit: Payer: Self-pay

## 2023-12-26 ENCOUNTER — Other Ambulatory Visit: Payer: Self-pay

## 2024-02-04 ENCOUNTER — Other Ambulatory Visit: Payer: Self-pay

## 2024-02-08 ENCOUNTER — Other Ambulatory Visit: Payer: Self-pay

## 2024-02-14 ENCOUNTER — Other Ambulatory Visit: Payer: Self-pay

## 2024-02-23 ENCOUNTER — Other Ambulatory Visit: Payer: Self-pay

## 2024-03-10 ENCOUNTER — Other Ambulatory Visit: Payer: Self-pay

## 2024-03-12 ENCOUNTER — Other Ambulatory Visit: Payer: Self-pay | Admitting: Gerontology

## 2024-03-12 DIAGNOSIS — Z8639 Personal history of other endocrine, nutritional and metabolic disease: Secondary | ICD-10-CM

## 2024-03-13 ENCOUNTER — Other Ambulatory Visit: Payer: Self-pay

## 2024-03-17 ENCOUNTER — Other Ambulatory Visit: Payer: Self-pay | Admitting: Gerontology

## 2024-03-17 ENCOUNTER — Other Ambulatory Visit: Payer: Self-pay

## 2024-03-17 DIAGNOSIS — Z8639 Personal history of other endocrine, nutritional and metabolic disease: Secondary | ICD-10-CM

## 2024-03-18 ENCOUNTER — Ambulatory Visit: Payer: Self-pay | Admitting: Gerontology

## 2024-03-18 ENCOUNTER — Other Ambulatory Visit: Payer: Self-pay

## 2024-03-21 ENCOUNTER — Other Ambulatory Visit: Payer: Self-pay

## 2024-03-22 ENCOUNTER — Other Ambulatory Visit: Payer: Self-pay

## 2024-03-26 ENCOUNTER — Other Ambulatory Visit: Payer: Self-pay

## 2024-03-26 MED FILL — Atorvastatin Calcium Tab 80 MG (Base Equivalent): ORAL | 90 days supply | Qty: 90 | Fill #0 | Status: AC

## 2024-03-26 NOTE — Telephone Encounter (Signed)
 A user error has taken place: encounter opened in error, closed for administrative reasons.

## 2024-03-28 ENCOUNTER — Other Ambulatory Visit: Payer: Self-pay

## 2024-04-01 ENCOUNTER — Ambulatory Visit: Payer: Self-pay | Admitting: Gerontology

## 2024-04-02 ENCOUNTER — Other Ambulatory Visit: Payer: Self-pay

## 2024-04-02 ENCOUNTER — Other Ambulatory Visit: Payer: Self-pay | Admitting: Gerontology

## 2024-04-02 DIAGNOSIS — I10 Essential (primary) hypertension: Secondary | ICD-10-CM

## 2024-04-08 ENCOUNTER — Other Ambulatory Visit: Payer: Self-pay

## 2024-04-08 ENCOUNTER — Ambulatory Visit: Payer: Self-pay | Admitting: Gerontology

## 2024-04-08 MED ORDER — AMLODIPINE BESYLATE 5 MG PO TABS
5.0000 mg | ORAL_TABLET | Freq: Every day | ORAL | 1 refills | Status: AC
  Filled 2024-04-08: qty 90, 90d supply, fill #0

## 2024-04-09 ENCOUNTER — Other Ambulatory Visit: Payer: Self-pay

## 2024-04-11 ENCOUNTER — Other Ambulatory Visit: Payer: Self-pay

## 2024-05-01 ENCOUNTER — Ambulatory Visit: Payer: Self-pay | Admitting: Gerontology
